# Patient Record
Sex: Male | Born: 1965 | Race: White | Hispanic: No | Smoking: Current every day smoker
Health system: Southern US, Community
[De-identification: ages and names within clinical notes are randomized; demographics above are authoritative.]

## PROBLEM LIST (undated history)

## (undated) DIAGNOSIS — IMO0002 Reserved for concepts with insufficient information to code with codable children: Secondary | ICD-10-CM

## (undated) DIAGNOSIS — M79604 Pain in right leg: Secondary | ICD-10-CM

## (undated) DIAGNOSIS — G8929 Other chronic pain: Secondary | ICD-10-CM

## (undated) DIAGNOSIS — K219 Gastro-esophageal reflux disease without esophagitis: Secondary | ICD-10-CM

## (undated) DIAGNOSIS — F329 Major depressive disorder, single episode, unspecified: Secondary | ICD-10-CM

## (undated) DIAGNOSIS — N200 Calculus of kidney: Secondary | ICD-10-CM

## (undated) DIAGNOSIS — F32A Depression, unspecified: Secondary | ICD-10-CM

## (undated) DIAGNOSIS — M25569 Pain in unspecified knee: Secondary | ICD-10-CM

## (undated) DIAGNOSIS — G4733 Obstructive sleep apnea (adult) (pediatric): Secondary | ICD-10-CM

## (undated) DIAGNOSIS — F419 Anxiety disorder, unspecified: Secondary | ICD-10-CM

## (undated) DIAGNOSIS — R0600 Dyspnea, unspecified: Secondary | ICD-10-CM

## (undated) HISTORY — DX: Other chronic pain: M25.569

## (undated) HISTORY — DX: Reserved for concepts with insufficient information to code with codable children: IMO0002

## (undated) HISTORY — DX: Pain in right leg: M79.604

## (undated) HISTORY — DX: Other chronic pain: G89.29

## (undated) HISTORY — DX: Anxiety disorder, unspecified: F41.9

## (undated) HISTORY — DX: Depression, unspecified: F32.A

## (undated) HISTORY — DX: Obstructive sleep apnea (adult) (pediatric): G47.33

## (undated) HISTORY — DX: Major depressive disorder, single episode, unspecified: F32.9

## (undated) HISTORY — PX: KNEE SURGERY: SHX244

---

## 1999-10-27 ENCOUNTER — Encounter: Admission: RE | Admit: 1999-10-27 | Discharge: 1999-10-27 | Payer: Self-pay

## 1999-10-27 ENCOUNTER — Encounter: Payer: Self-pay | Admitting: Oral Surgery

## 1999-11-05 ENCOUNTER — Encounter: Admission: RE | Admit: 1999-11-05 | Discharge: 1999-11-05 | Payer: Self-pay | Admitting: *Deleted

## 1999-11-05 ENCOUNTER — Encounter: Payer: Self-pay | Admitting: Oral Surgery

## 2000-07-19 ENCOUNTER — Emergency Department (HOSPITAL_COMMUNITY): Admission: EM | Admit: 2000-07-19 | Discharge: 2000-07-19 | Payer: Self-pay | Admitting: *Deleted

## 2003-01-31 ENCOUNTER — Emergency Department (HOSPITAL_COMMUNITY): Admission: EM | Admit: 2003-01-31 | Discharge: 2003-01-31 | Payer: Self-pay | Admitting: Emergency Medicine

## 2003-02-03 ENCOUNTER — Emergency Department (HOSPITAL_COMMUNITY): Admission: EM | Admit: 2003-02-03 | Discharge: 2003-02-04 | Payer: Self-pay | Admitting: Emergency Medicine

## 2003-02-28 ENCOUNTER — Ambulatory Visit (HOSPITAL_COMMUNITY): Admission: RE | Admit: 2003-02-28 | Discharge: 2003-02-28 | Payer: Self-pay | Admitting: Orthopedic Surgery

## 2006-02-28 DIAGNOSIS — IMO0002 Reserved for concepts with insufficient information to code with codable children: Secondary | ICD-10-CM

## 2006-02-28 HISTORY — DX: Reserved for concepts with insufficient information to code with codable children: IMO0002

## 2006-12-16 ENCOUNTER — Emergency Department (HOSPITAL_COMMUNITY): Admission: EM | Admit: 2006-12-16 | Discharge: 2006-12-16 | Payer: Self-pay | Admitting: Emergency Medicine

## 2007-03-01 HISTORY — PX: KIDNEY STONE SURGERY: SHX686

## 2007-09-11 ENCOUNTER — Emergency Department (HOSPITAL_COMMUNITY): Admission: EM | Admit: 2007-09-11 | Discharge: 2007-09-11 | Payer: Self-pay | Admitting: Emergency Medicine

## 2007-10-11 ENCOUNTER — Emergency Department (HOSPITAL_COMMUNITY): Admission: EM | Admit: 2007-10-11 | Discharge: 2007-10-11 | Payer: Self-pay | Admitting: Emergency Medicine

## 2007-10-22 ENCOUNTER — Encounter: Payer: Self-pay | Admitting: Orthopedic Surgery

## 2007-10-22 ENCOUNTER — Emergency Department (HOSPITAL_COMMUNITY): Admission: EM | Admit: 2007-10-22 | Discharge: 2007-10-22 | Payer: Self-pay | Admitting: Emergency Medicine

## 2007-10-24 ENCOUNTER — Ambulatory Visit: Payer: Self-pay | Admitting: Orthopedic Surgery

## 2007-10-24 DIAGNOSIS — M545 Low back pain, unspecified: Secondary | ICD-10-CM | POA: Insufficient documentation

## 2007-10-26 ENCOUNTER — Encounter: Payer: Self-pay | Admitting: Orthopedic Surgery

## 2007-11-13 ENCOUNTER — Telehealth: Payer: Self-pay | Admitting: Orthopedic Surgery

## 2007-11-26 ENCOUNTER — Encounter
Admission: RE | Admit: 2007-11-26 | Discharge: 2007-11-27 | Payer: Self-pay | Admitting: Physical Medicine & Rehabilitation

## 2007-11-27 ENCOUNTER — Ambulatory Visit: Payer: Self-pay | Admitting: Physical Medicine & Rehabilitation

## 2007-12-06 ENCOUNTER — Ambulatory Visit (HOSPITAL_COMMUNITY)
Admission: RE | Admit: 2007-12-06 | Discharge: 2007-12-06 | Payer: Self-pay | Admitting: Physical Medicine & Rehabilitation

## 2008-01-30 ENCOUNTER — Emergency Department (HOSPITAL_COMMUNITY): Admission: EM | Admit: 2008-01-30 | Discharge: 2008-01-30 | Payer: Self-pay | Admitting: Emergency Medicine

## 2008-10-31 ENCOUNTER — Emergency Department (HOSPITAL_COMMUNITY): Admission: EM | Admit: 2008-10-31 | Discharge: 2008-10-31 | Payer: Self-pay | Admitting: Emergency Medicine

## 2009-04-15 ENCOUNTER — Ambulatory Visit (HOSPITAL_COMMUNITY): Admission: RE | Admit: 2009-04-15 | Discharge: 2009-04-15 | Payer: Self-pay | Admitting: Orthopaedic Surgery

## 2010-03-20 ENCOUNTER — Encounter: Payer: Self-pay | Admitting: Orthopedic Surgery

## 2010-03-21 ENCOUNTER — Encounter: Payer: Self-pay | Admitting: Physical Medicine & Rehabilitation
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
## 2010-07-13 NOTE — H&P (Signed)
NAMESHILO, PAUWELS              ACCOUNT NO.:  [id number]   MEDICAL RECORD NO.:  [id number]          PATIENT TYPE:  AMB   LOCATION:  DAY                           FACILITY:  [facility]   PHYSICIAN:  Ky Barban, M.D.DATE OF BIRTH:  [birth year]   DATE OF ADMISSION:  DATE OF DISCHARGE:  LH                              HISTORY & PHYSICAL   CHIEF COMPLAINT:  Frequent left renal colic.   HISTORY:  A 45 year old gentleman who is well known to me.  He has  frequent episodes of left renal colic.  He went to the emergency room.  A CT scan showed there is a 6-mm stone in the distal left ureter causing  some restriction, and the patient was referred to me for further  management.  He is not having any fever, chills, and voiding complaints.  He continued to have pain and wants to get the stone out, so I have  advised him to undergo stone basket for which he is coming tomorrow.  He  will undergo a cystoscopy, left retrograde pyelogram, ureteroscopic  stone basket, Holmium laser lithotripsy, and use a Double-J stent.  He  is familiar with the procedure because I have done the same procedure on  the right side last year.  I have discussed possible complications,  especially ureter perforations leading to open surgery.   PAST MEDICAL HISTORY:  Unremarkable except stone basket last year.   PHYSICAL EXAMINATION:  GENERAL:  Moderately built, not in acute  distress.  VITAL SIGNS:  Blood pressure 109/65, temperature is 97.5.  CNS:  Negative.  HEAD, NECK, ENT:  Negative.  CHEST:  Symmetrical.  HEART:  Regular sinus rhythm.  No murmur.  ABDOMEN:  Soft, flat.  Liver screen, kidneys are not palpable.  BACK:  No CVA tenderness.  EXTREMITY/GENITALIA:  Unremarkable.   IMPRESSION:  Left uretal calculus.   PLAN:  Cystoscopy, left retrograde pyelogram, ureteroscopic stone  basket, Holmium laser lithotripsy, and insertion of a Double-J stent  done under anesthesia as an outpatient.      Ky Barban, M.D.  Electronically Signed     MIJ/MEDQ  D:  06/04/2007  T:  06/04/2007  Job:  [id number]

## 2011-04-25 ENCOUNTER — Ambulatory Visit: Payer: Self-pay | Admitting: Family Medicine

## 2011-04-27 ENCOUNTER — Encounter: Payer: Self-pay | Admitting: Family Medicine

## 2011-05-23 ENCOUNTER — Ambulatory Visit (INDEPENDENT_AMBULATORY_CARE_PROVIDER_SITE_OTHER): Payer: Medicare Other | Admitting: Family Medicine

## 2011-05-23 ENCOUNTER — Encounter: Payer: Self-pay | Admitting: Family Medicine

## 2011-05-23 VITALS — BP 122/74 | HR 58 | Resp 18 | Ht 68.0 in | Wt 327.0 lb

## 2011-05-23 DIAGNOSIS — M79609 Pain in unspecified limb: Secondary | ICD-10-CM

## 2011-05-23 DIAGNOSIS — M545 Low back pain, unspecified: Secondary | ICD-10-CM

## 2011-05-23 DIAGNOSIS — Z720 Tobacco use: Secondary | ICD-10-CM

## 2011-05-23 DIAGNOSIS — R7309 Other abnormal glucose: Secondary | ICD-10-CM

## 2011-05-23 DIAGNOSIS — E669 Obesity, unspecified: Secondary | ICD-10-CM

## 2011-05-23 DIAGNOSIS — F17200 Nicotine dependence, unspecified, uncomplicated: Secondary | ICD-10-CM

## 2011-05-23 DIAGNOSIS — M25559 Pain in unspecified hip: Secondary | ICD-10-CM

## 2011-05-23 DIAGNOSIS — G4733 Obstructive sleep apnea (adult) (pediatric): Secondary | ICD-10-CM | POA: Insufficient documentation

## 2011-05-23 DIAGNOSIS — R739 Hyperglycemia, unspecified: Secondary | ICD-10-CM

## 2011-05-23 DIAGNOSIS — M79676 Pain in unspecified toe(s): Secondary | ICD-10-CM | POA: Insufficient documentation

## 2011-05-23 MED ORDER — OXYCODONE-ACETAMINOPHEN 7.5-325 MG PO TABS: 1.0000 | ORAL_TABLET | Freq: Two times a day (BID) | ORAL | Status: DC | PRN

## 2011-05-23 NOTE — Assessment & Plan Note (Signed)
Acute on chronic pain, given pain meds for 30 days, I will obtain records

## 2011-05-23 NOTE — Patient Instructions (Signed)
Use the pain medication twice a day  Try to walk as much  I will get records  Get the labs done we will review at your next visit I recommend you quit smoking! F/U in 4 weeks

## 2011-05-23 NOTE — Assessment & Plan Note (Signed)
Exam unremarkable, will obtain uric acid level

## 2011-05-23 NOTE — Assessment & Plan Note (Signed)
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

## 2011-05-23 NOTE — Assessment & Plan Note (Signed)
Obtain labs, will review, if he has no other new co-morbidites he may be a good candidate for phentermine
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
## 2011-05-23 NOTE — Assessment & Plan Note (Signed)
Discussed need for cessation 

## 2011-05-27 LAB — COMPREHENSIVE METABOLIC PANEL
Alkaline Phosphatase: 71 U/L (ref 39–117)
BUN: 9 mg/dL (ref 6–23)
Glucose, Bld: 102 mg/dL — ABNORMAL HIGH (ref 70–99)
Total Bilirubin: 0.2 mg/dL — ABNORMAL LOW (ref 0.3–1.2)

## 2011-05-27 LAB — LIPID PANEL
Cholesterol: 177 mg/dL (ref 0–200)
LDL Cholesterol: 118 mg/dL — ABNORMAL HIGH (ref 0–99)
Total CHOL/HDL Ratio: 5.4 Ratio

## 2011-05-27 LAB — CBC
MCH: 29.8 pg (ref 26.0–34.0)
MCHC: 32.5 g/dL (ref 30.0–36.0)
Platelets: 386 10*3/uL (ref 150–400)
RDW: 15.1 % (ref 11.5–15.5)
WBC: 10.5 10*3/uL (ref 4.0–10.5)

## 2011-06-08 ENCOUNTER — Telehealth: Payer: Self-pay | Admitting: Family Medicine

## 2011-06-08 NOTE — Telephone Encounter (Signed)
We can discuss this at his f/u visit

## 2011-06-09 NOTE — Telephone Encounter (Signed)
noted 

## 2011-06-21 ENCOUNTER — Other Ambulatory Visit: Payer: Self-pay | Admitting: *Deleted

## 2011-06-21 ENCOUNTER — Ambulatory Visit (INDEPENDENT_AMBULATORY_CARE_PROVIDER_SITE_OTHER): Payer: Medicare Other

## 2011-06-21 ENCOUNTER — Ambulatory Visit (INDEPENDENT_AMBULATORY_CARE_PROVIDER_SITE_OTHER): Payer: Medicare Other | Admitting: Orthopedic Surgery

## 2011-06-21 ENCOUNTER — Encounter: Payer: Self-pay | Admitting: Orthopedic Surgery

## 2011-06-21 VITALS — BP 126/60 | Ht 67.0 in | Wt 327.0 lb

## 2011-06-21 DIAGNOSIS — G8929 Other chronic pain: Secondary | ICD-10-CM

## 2011-06-21 DIAGNOSIS — M25569 Pain in unspecified knee: Secondary | ICD-10-CM

## 2011-06-21 DIAGNOSIS — M25561 Pain in right knee: Secondary | ICD-10-CM

## 2011-06-21 NOTE — Patient Instructions (Signed)
Refer to pain clinic? 

## 2011-06-22 ENCOUNTER — Encounter: Payer: Self-pay | Admitting: Orthopedic Surgery

## 2011-06-22 DIAGNOSIS — G8929 Other chronic pain: Secondary | ICD-10-CM | POA: Insufficient documentation

## 2011-06-22 DIAGNOSIS — M25561 Pain in right knee: Secondary | ICD-10-CM | POA: Insufficient documentation

## 2011-06-22 NOTE — Progress Notes (Signed)
  Subjective:    Michael Tate is a 46 y.o. male who presents with knee pain involving The right knee and the right tibia. Onset was gradual, starting about several Years ago. Inciting event: Was a fracture which was treated with intramedullary nailing. The nail fractured and the patient subsequently was treated with plate fixation at [facility]. Current symptoms include: Pain over the shin proximally as well as pain over the medial femoral condyle. He is also suffering from chronic back pain and chronic pain syndrome.. Pain is aggravated by any weight bearing, going up and down stairs, rising after sitting, standing and walking. Patient has had prior knee problems. Evaluation to date: none. Treatment to date: none.  The following portions of the patient's history were reviewed and updated as appropriate: allergies, current medications, past family history, past medical history, past social history, past surgical history and problem list.   Review of Systems Pertinent items are noted in HPI.   Objective:    BP 126/60  Ht 5\' 7"  (1.702 m)  Wt 327 lb (148.326 kg)  BMI 51.22 kg/m2 Right knee: positive exam findings: crepitus, medial joint line tenderness, ROM limited to approximately 115 degrees and  and negative exam findings: no effusion, no erythema, ACL stable, PCL stable, MCL stable, LCL stable, no patellar laxity and There is also tenderness and pain over the proximal shin. There are 2 incisions one midline one starts medial and curves into the midline incision both are clean without signs of erythema or tenderness  Left knee:  normal and no effusion, full active range of motion, no joint line tenderness, ligamentous structures intact.   X-ray right knee: The medial compartment is compromised with moderate arthrosis and joint space narrowing and there is a plate fixating a previously proximal tibial fracture with good union and no complicating features from the hardware    Assessment:     Right Knee pain. Chronic pain. Chronic back pain.    Plan:    The patient is not a candidate for surgical intervention at this time. He needs chronic pain management and agreed that he really came in to the office today to try to get his pain under control. He is referred to a pain management clinic.

## 2011-06-23 ENCOUNTER — Ambulatory Visit (INDEPENDENT_AMBULATORY_CARE_PROVIDER_SITE_OTHER): Payer: Medicare Other | Admitting: Family Medicine

## 2011-06-23 ENCOUNTER — Encounter: Payer: Self-pay | Admitting: Family Medicine

## 2011-06-23 VITALS — BP 128/74 | HR 72 | Resp 20 | Ht 68.0 in | Wt 328.1 lb

## 2011-06-23 DIAGNOSIS — Z720 Tobacco use: Secondary | ICD-10-CM

## 2011-06-23 DIAGNOSIS — F17200 Nicotine dependence, unspecified, uncomplicated: Secondary | ICD-10-CM

## 2011-06-23 DIAGNOSIS — R7989 Other specified abnormal findings of blood chemistry: Secondary | ICD-10-CM

## 2011-06-23 DIAGNOSIS — Z0289 Encounter for other administrative examinations: Secondary | ICD-10-CM

## 2011-06-23 DIAGNOSIS — G4733 Obstructive sleep apnea (adult) (pediatric): Secondary | ICD-10-CM

## 2011-06-23 DIAGNOSIS — Z20828 Contact with and (suspected) exposure to other viral communicable diseases: Secondary | ICD-10-CM

## 2011-06-23 DIAGNOSIS — G8929 Other chronic pain: Secondary | ICD-10-CM

## 2011-06-23 MED ORDER — OXYCODONE-ACETAMINOPHEN 7.5-325 MG PO TABS: 1.0000 | ORAL_TABLET | Freq: Two times a day (BID) | ORAL | Status: DC | PRN

## 2011-06-23 NOTE — Patient Instructions (Signed)
I will set you up for a sleep study You have signed a pain contract Work on the smoking  Albuterol inhaler as needed Liver ultrasound to be done Get the bloodwork for your liver- we will call with results F/U 4 months

## 2011-06-24 ENCOUNTER — Encounter: Payer: Self-pay | Admitting: *Deleted

## 2011-06-24 LAB — DRUG SCREEN, URINE
Amphetamine Screen, Ur: NEGATIVE
Barbiturate Quant, Ur: NEGATIVE
Cocaine Metabolites: NEGATIVE
Opiates: NEGATIVE

## 2011-06-24 LAB — HEPATIC FUNCTION PANEL
ALT: 28 U/L (ref 0–53)
AST: 20 U/L (ref 0–37)
Alkaline Phosphatase: 69 U/L (ref 39–117)
Bilirubin, Direct: <0.1 mg/dL (ref 0.0–0.3)
Total Bilirubin: 0.2 mg/dL — ABNORMAL LOW (ref 0.3–1.2)

## 2011-06-24 LAB — HEPATITIS C ANTIBODY: HCV Ab: NEGATIVE

## 2011-06-24 NOTE — Assessment & Plan Note (Signed)
Pt was high risk for exposure with incarceration, other differentials fatty liver disease, though TG are normal. Will send for HIV, hep B and Hep c labs, ultrasound of RUQ

## 2011-06-24 NOTE — Assessment & Plan Note (Signed)
UDS obtained, pain contract signed, I will give him #90 Percocet, to be used TID prn If this does not help he will need referral to pain clinic

## 2011-06-24 NOTE — Progress Notes (Signed)
Patient ID: Michael Tate, male   DOB: 1965/05/08, 46 y.o.   MRN: [id number] Referral was faxed to dr Gerilyn Pilgrim for pain management

## 2011-06-24 NOTE — Progress Notes (Signed)
  Subjective:    Patient ID: Michael Tate, male    DOB: 1965-06-20, 46 y.o.   MRN: [id number]  HPI   Patient here to followup chronic pain. He was seen by orthopedic surgeon regarding his anemia was told there was nothing operable he can do. He will need to be referred to a pain clinic. He ran out of his medication 10 days early as he required more. Today she presents me with a note stating how severe his pain is. For the first 28 days when he was on medication he was able to move around and get things done. He states he's been a burden on his son and his mother. I will scan this letter in to the chart for documentation. He is asking for specific pain medications which have helped him in the past. He is also requesting sleep study for his sleep apnea he needs a new machine and has been many years since he has had workup for this.  Review of Systems   GEN- denies fatigue, fever, weight loss,weakness, recent illness HEENT- denies eye drainage, change in vision, nasal discharge, CVS- denies chest pain, palpitations RESP- denies SOB, cough, wheeze ABD- denies N/V, change in stools, abd pain GU- denies dysuria, hematuria, dribbling, incontinence MSK- + joint pain, muscle aches, injury Neuro- denies headache, dizziness, syncope, seizure activity    Objective:   Physical Exam EN- NAD, alert and oriented x3,morbidly obese,walks with cane HEENT-  EOMI, non injected sclera,  Non icteric pink conjunctiva, MMM, oropharynx clear CVS- RRR, no murmur RESP-CTAB ABD- NABS,soft, NT,ND, unable to palpate liver, large abdomen EXT- No edema Pulses- Radial, DP- 2+ Neuro-antalgic gait        Assessment & Plan:

## 2011-06-24 NOTE — Assessment & Plan Note (Addendum)
Epworth scale given score 17/24, obtain sleep study  Sleep study from May 2013 shows pt has OSA, and I believe he would benefit from CPAP use. Will order CPAP machine

## 2011-06-24 NOTE — Assessment & Plan Note (Signed)
Continue to encourage cessation. 

## 2011-06-27 ENCOUNTER — Telehealth: Payer: Self-pay | Admitting: Family Medicine

## 2011-06-27 MED ORDER — ALPRAZOLAM 1 MG PO TABS: ORAL_TABLET | ORAL | Status: DC

## 2011-06-27 MED ORDER — SERTRALINE HCL 100 MG PO TABS: ORAL_TABLET | ORAL | Status: DC

## 2011-06-27 MED ORDER — ARIPIPRAZOLE 5 MG PO TABS: 5.0000 mg | ORAL_TABLET | Freq: Every day | ORAL | Status: DC

## 2011-06-27 NOTE — Telephone Encounter (Signed)
I spoke with patient regarding this his failed drug test. He states that he is going to mental health he did not want to get this information because of the stigma it presents. He is currently on alprazolam 1 mg 5 times a day, Zoloft 200 mg daily and Abilify 5 mg daily. He's been prescribed these by Dr.Rey. I checked the database and he is being prescribed these medications on a monthly basis by [facility] mental health. Therefore he did not fail his drug test

## 2011-06-27 NOTE — Telephone Encounter (Signed)
LVM for pt to return call. He failed his UDS, will need to be referred to pain clinic, it appears Dr. Romeo Apple has already done this

## 2011-07-08 ENCOUNTER — Other Ambulatory Visit: Payer: Self-pay

## 2011-07-13 ENCOUNTER — Ambulatory Visit: Payer: Medicare Other | Attending: Family Medicine | Admitting: Sleep Medicine

## 2011-07-13 DIAGNOSIS — Z6841 Body Mass Index (BMI) 40.0 and over, adult: Secondary | ICD-10-CM | POA: Insufficient documentation

## 2011-07-13 DIAGNOSIS — G4733 Obstructive sleep apnea (adult) (pediatric): Secondary | ICD-10-CM | POA: Insufficient documentation

## 2011-07-14 NOTE — Progress Notes (Signed)
NPSG study ordered, however, Dr Ronal Fear emergency split protocol  was met and PAP therapy initiated with CPAP and then BiLevel to resolve respiratory events

## 2011-07-16 NOTE — Procedures (Signed)
[name], Michael Tate              ACCOUNT NO.:  [id number]  MEDICAL RECORD NO.:  [id number]          PATIENT TYPE:  OUT  LOCATION:  SLEEP LAB                     FACILITY:  [facility]  PHYSICIAN:  Katalin Colledge A. Gerilyn Pilgrim, M.D. DATE OF BIRTH:  [birth year]  DATE OF STUDY:  07/13/2011                           NOCTURNAL POLYSOMNOGRAM  REFERRING PHYSICIAN:  Milinda Antis, MD  INDICATION:  A 46 year old man, who has a history of obstructive sleep apnea syndrome, previously has used the machine, but is currently not working.  He has hypersomnia, fatigue with witnessed apnea and snoring.  EPWORTH SLEEPINESS SCORE:  16.  BMI:  48.  ARCHITECTURAL SUMMARY:  This is a split night recording with initial portion being a diagnostic and the second portion a titration recording. The total recording time is 401 minutes.  Sleep efficiency 82%.  Sleep latency 20 minutes.  REM latency 250 minutes.  RESPIRATORY SUMMARY:  Baseline oxygen saturation is 95.  Lowest saturation 79 during non-REM sleep.  Diagnostic AHI is 90.  The patient was started on positive pressure between 8 and titrated to a pressure of 14.  He also did well while on CPAP.  He did have a couple of central apneas on level 14 and was subsequently switched to bilevel pressures. He did really well on all the bilevel pressures.  Optimal bilevel pressure 18/14.  LIMB MOVEMENT SUMMARY:  PLM index 1.  ELECTROCARDIOGRAM SUMMARY:  Average heart rate is 67 with no significant dysrhythmias observed.  IMPRESSION:  Severe obstructive sleep apnea syndrome, which responds well to CPAP of 14.  He also responded well to bilevel of 18/14.  RECOMMENDATION:  I would suggest CPAP of 14.  MEDICATIONS:  Zoloft, alprazolam, Abilify.    Vidhi Delellis A. Gerilyn Pilgrim, M.D.    KAD/MEDQ  D:  07/16/2011 17:23:56  T:  07/16/2011 23:46:14  Job:  [id number]

## 2011-07-19 ENCOUNTER — Telehealth: Payer: Self-pay | Admitting: Family Medicine

## 2011-07-19 NOTE — Telephone Encounter (Signed)
You can print, he can pick up but he can not fill until Thursday.

## 2011-07-19 NOTE — Telephone Encounter (Signed)
Called patient and left message for them to return call at the office   

## 2011-07-19 NOTE — Telephone Encounter (Signed)
Ok to collect this now? Due on the 25th

## 2011-07-19 NOTE — Telephone Encounter (Signed)
Patient aware.

## 2011-07-20 ENCOUNTER — Other Ambulatory Visit: Payer: Self-pay

## 2011-07-20 MED ORDER — OXYCODONE-ACETAMINOPHEN 7.5-325 MG PO TABS: 1.0000 | ORAL_TABLET | Freq: Two times a day (BID) | ORAL | Status: DC | PRN

## 2011-07-20 MED ORDER — OXYCODONE-ACETAMINOPHEN 7.5-325 MG PO TABS: 1.0000 | ORAL_TABLET | Freq: Three times a day (TID) | ORAL | Status: DC | PRN

## 2011-07-26 ENCOUNTER — Telehealth: Payer: Self-pay

## 2011-07-26 NOTE — Telephone Encounter (Signed)
Pt would like to know the results of his sleep study [phone]

## 2011-07-26 NOTE — Telephone Encounter (Signed)
Pt aware.

## 2011-07-26 NOTE — Telephone Encounter (Signed)
He has severe sleep apnea and needs the CPAP Machine, this will be sent to [facility]

## 2011-08-17 ENCOUNTER — Telehealth: Payer: Self-pay | Admitting: Family Medicine

## 2011-08-17 NOTE — Telephone Encounter (Signed)
I did receive his letter, he can not get prescription until Friday, I will not be increasing the amount.  You can print #90 and I will sign tomorrow

## 2011-08-17 NOTE — Telephone Encounter (Signed)
Called and left message for pt to return call.  

## 2011-08-18 ENCOUNTER — Other Ambulatory Visit: Payer: Self-pay

## 2011-08-18 ENCOUNTER — Telehealth: Payer: Self-pay | Admitting: Family Medicine

## 2011-08-18 MED ORDER — OXYCODONE-ACETAMINOPHEN 7.5-325 MG PO TABS: 1.0000 | ORAL_TABLET | Freq: Three times a day (TID) | ORAL | Status: DC | PRN

## 2011-08-18 NOTE — Telephone Encounter (Signed)
Spoke with pt and he is aware that he can collect script on 6/21

## 2011-08-18 NOTE — Telephone Encounter (Signed)
Pt aware that he can pick up rx on Friday 6/21 and that he will not receive an increase in quantity.

## 2011-09-14 ENCOUNTER — Telehealth: Payer: Self-pay | Admitting: Family Medicine

## 2011-09-14 NOTE — Telephone Encounter (Signed)
Depending on the weather and his activity he is running out of meds for 2 days. Tomorrow he will be out of meds. Is there any he can get an extra 10 pills a month. He tries to take it only when he absolutely needs it but no matter how hard he tries he runs out a couple days before the end of the month. OR if you can't increase the quantity, could you increase the mg. He is always out the last 2 days of each month and is in severe pain until he can get his meds again

## 2011-09-14 NOTE — Telephone Encounter (Signed)
Please schedule appt

## 2011-09-14 NOTE — Telephone Encounter (Signed)
Please have him schedule an office visit.  

## 2011-09-15 ENCOUNTER — Encounter: Payer: Self-pay | Admitting: Family Medicine

## 2011-09-15 ENCOUNTER — Ambulatory Visit (INDEPENDENT_AMBULATORY_CARE_PROVIDER_SITE_OTHER): Payer: Medicare Other | Admitting: Family Medicine

## 2011-09-15 VITALS — BP 140/84 | HR 81 | Resp 18 | Ht 68.0 in | Wt 327.0 lb

## 2011-09-15 DIAGNOSIS — G8929 Other chronic pain: Secondary | ICD-10-CM | POA: Insufficient documentation

## 2011-09-15 DIAGNOSIS — Z2089 Contact with and (suspected) exposure to other communicable diseases: Secondary | ICD-10-CM | POA: Insufficient documentation

## 2011-09-15 DIAGNOSIS — H109 Unspecified conjunctivitis: Secondary | ICD-10-CM | POA: Insufficient documentation

## 2011-09-15 DIAGNOSIS — Z113 Encounter for screening for infections with a predominantly sexual mode of transmission: Secondary | ICD-10-CM

## 2011-09-15 DIAGNOSIS — R300 Dysuria: Secondary | ICD-10-CM | POA: Insufficient documentation

## 2011-09-15 DIAGNOSIS — Z202 Contact with and (suspected) exposure to infections with a predominantly sexual mode of transmission: Secondary | ICD-10-CM

## 2011-09-15 LAB — POCT URINALYSIS DIPSTICK
Bilirubin, UA: NEGATIVE
Blood, UA: NEGATIVE
Glucose, UA: NEGATIVE
Ketones, UA: NEGATIVE
Leukocytes, UA: NEGATIVE
pH, UA: 7

## 2011-09-15 MED ORDER — OXYCODONE-ACETAMINOPHEN 7.5-325 MG PO TABS: 1.0000 | ORAL_TABLET | Freq: Three times a day (TID) | ORAL | Status: DC | PRN

## 2011-09-15 MED ORDER — TAMSULOSIN HCL 0.4 MG PO CAPS: 0.4000 mg | ORAL_CAPSULE | Freq: Every day | ORAL | Status: DC

## 2011-09-15 MED ORDER — METRONIDAZOLE 500 MG PO TABS: 500.0000 mg | ORAL_TABLET | Freq: Two times a day (BID) | ORAL | Status: AC

## 2011-09-15 MED ORDER — POLYMYXIN B-TRIMETHOPRIM 10000-0.1 UNIT/ML-% OP SOLN: 1.0000 [drp] | Freq: Four times a day (QID) | OPHTHALMIC | Status: AC

## 2011-09-15 NOTE — Patient Instructions (Addendum)
Take the antibiotic for the eyes for the conjunctivitis Take the flagyl for infection Continue your CPAP Phentermine start 1/2 tablet for 2 weeks then increase to 1 tablets - start after the flagyl  Fresh fruits and veggies, avoid fried foods, bread  Start flomax F/U 3 months

## 2011-09-15 NOTE — Progress Notes (Signed)
  Subjective:    Patient ID: Michael Tate, male    DOB: 01/31/1966, 46 y.o.   MRN: [id number]  HPI Pt presents s/p exposure to Trichomonas, he has been have eye drainage for 1 month, this is same time frame as exposure he has also had burning sensation with urination and in groin. Past week he has had difficulty with his urine stream. He was concerned he had a kidney stone. Denies penile discharge, penile lesions   Review of Systems  GEN- denies fatigue, fever, weight loss,weakness, recent illness HEENT- denies eye drainage, change in vision, nasal discharge, CVS- denies chest pain, palpitations RESP- denies SOB, cough, wheeze ABD- denies N/V, change in stools, abd pain GU- + dysuria, hematuria, dribbling, incontinence MSK- + joint pain, muscle aches, injury Neuro- denies headache, dizziness, syncope, seizure activity      Objective:   Physical Exam GEN- NAD, alert and oriented x3, obese  HEENT- PERRL, EOMI, non injected sclera, injected conjunctiva, MMM, oropharynx clear, mild clear drainage from bilat eye CVS- RRR, no murmur RESP-CTAB ABD-NABS,soft, NT,ND, no CVA tenderness EXT- No edema Pulses- Radial, DP- 2+ GU- pt declined  UA-negative, no blood seen      Assessment & Plan:

## 2011-09-18 ENCOUNTER — Encounter: Payer: Self-pay | Admitting: Family Medicine

## 2011-09-18 DIAGNOSIS — H109 Unspecified conjunctivitis: Secondary | ICD-10-CM | POA: Insufficient documentation

## 2011-09-18 DIAGNOSIS — Z202 Contact with and (suspected) exposure to infections with a predominantly sexual mode of transmission: Secondary | ICD-10-CM | POA: Insufficient documentation

## 2011-09-18 NOTE — Assessment & Plan Note (Signed)
Treat for trichomonas with flagyl, urine GC/Chlamydia done as pt refused exam, he denied any penile discharge or lesions.

## 2011-09-18 NOTE — Assessment & Plan Note (Signed)
On pain contract, we discussed him asking for more meds, and increased doses, will change monthly count to 100 tablets, advised if this does not control pain he will need pain management referral

## 2011-09-18 NOTE — Assessment & Plan Note (Signed)
Culture taken from eye but very little non purulent discharge, treat presumptively with polytrim, possible crusting due to new CPAP Mask as well

## 2011-09-20 ENCOUNTER — Other Ambulatory Visit (HOSPITAL_COMMUNITY)
Admission: RE | Admit: 2011-09-20 | Discharge: 2011-09-20 | Disposition: A | Discharge status: Home / Self Care | Payer: Medicare Other | Source: Ambulatory Visit | Attending: Family Medicine | Admitting: Family Medicine

## 2011-09-22 ENCOUNTER — Telehealth: Payer: Self-pay | Admitting: Family Medicine

## 2011-10-10 MED ORDER — PHENTERMINE HCL 37.5 MG PO CAPS: 37.5000 mg | ORAL_CAPSULE | ORAL | Status: DC

## 2011-10-10 NOTE — Telephone Encounter (Signed)
Called pt and left message. Phentermine sent in

## 2011-10-11 ENCOUNTER — Other Ambulatory Visit: Payer: Self-pay

## 2011-10-11 MED ORDER — OXYCODONE-ACETAMINOPHEN 7.5-325 MG PO TABS
1.0000 | ORAL_TABLET | Freq: Three times a day (TID) | ORAL | Status: DC | PRN
Start: 2011-10-11 — End: 2011-11-04

## 2011-10-21 ENCOUNTER — Ambulatory Visit: Payer: Medicare Other | Admitting: Family Medicine

## 2011-11-04 ENCOUNTER — Other Ambulatory Visit: Payer: Self-pay

## 2011-11-04 MED ORDER — OXYCODONE-ACETAMINOPHEN 7.5-325 MG PO TABS
1.0000 | ORAL_TABLET | Freq: Three times a day (TID) | ORAL | Status: DC | PRN
Start: 2011-11-04 — End: 2011-12-06

## 2011-11-09 ENCOUNTER — Telehealth: Payer: Self-pay | Admitting: Family Medicine

## 2011-11-09 NOTE — Telephone Encounter (Signed)
Called and left message notifying patient that he can pick up his rx on Friday 9/13

## 2011-11-14 ENCOUNTER — Ambulatory Visit: Payer: Medicare Other | Admitting: Family Medicine

## 2011-11-14 ENCOUNTER — Encounter: Payer: Self-pay | Admitting: Family Medicine

## 2011-12-06 ENCOUNTER — Telehealth: Payer: Self-pay | Admitting: Family Medicine

## 2011-12-06 MED ORDER — OXYCODONE-ACETAMINOPHEN 7.5-325 MG PO TABS
1.0000 | ORAL_TABLET | Freq: Three times a day (TID) | ORAL | Status: DC | PRN
Start: 2011-12-06 — End: 2012-01-04

## 2011-12-06 NOTE — Telephone Encounter (Signed)
Noted  

## 2012-01-03 ENCOUNTER — Telehealth: Payer: Self-pay | Admitting: Family Medicine

## 2012-01-04 MED ORDER — OXYCODONE-ACETAMINOPHEN 7.5-325 MG PO TABS
1.0000 | ORAL_TABLET | Freq: Three times a day (TID) | ORAL | Status: DC | PRN
Start: 2012-01-04 — End: 2012-01-24

## 2012-01-04 NOTE — Telephone Encounter (Signed)
Message left that it would be ready on Thursday

## 2012-01-12 ENCOUNTER — Ambulatory Visit: Payer: Medicare Other | Admitting: Family Medicine

## 2012-01-24 ENCOUNTER — Other Ambulatory Visit: Payer: Self-pay

## 2012-01-24 MED ORDER — OXYCODONE-ACETAMINOPHEN 7.5-325 MG PO TABS: 1.0000 | ORAL_TABLET | Freq: Three times a day (TID) | ORAL | Status: DC | PRN

## 2012-01-31 ENCOUNTER — Encounter: Payer: Self-pay | Admitting: Family Medicine

## 2012-01-31 ENCOUNTER — Ambulatory Visit: Payer: Medicare Other | Admitting: Family Medicine

## 2012-02-02 ENCOUNTER — Other Ambulatory Visit: Payer: Self-pay

## 2012-02-02 ENCOUNTER — Other Ambulatory Visit: Payer: Self-pay | Admitting: Family Medicine

## 2012-02-02 DIAGNOSIS — Z79899 Other long term (current) drug therapy: Secondary | ICD-10-CM

## 2012-02-03 LAB — DRUG SCREEN, URINE
Amphetamine Screen, Ur: NEGATIVE
Benzodiazepines.: NEGATIVE
Creatinine,U: 250.09 mg/dL
Methadone: NEGATIVE
Propoxyphene: NEGATIVE

## 2012-02-07 NOTE — Progress Notes (Signed)
Called pt and one number has ben disconnected and the other no one would speak.

## 2012-02-07 NOTE — Progress Notes (Signed)
Pt is coming in on Thursday 12th to see dr. Jeanice Lim. Pt is aware

## 2012-02-09 ENCOUNTER — Encounter: Payer: Self-pay | Admitting: Family Medicine

## 2012-02-09 ENCOUNTER — Ambulatory Visit (INDEPENDENT_AMBULATORY_CARE_PROVIDER_SITE_OTHER): Payer: Medicare Other | Admitting: Family Medicine

## 2012-02-09 VITALS — BP 118/74 | HR 76 | Resp 16 | Wt 325.0 lb

## 2012-02-09 DIAGNOSIS — G8929 Other chronic pain: Secondary | ICD-10-CM

## 2012-02-09 DIAGNOSIS — M5136 Other intervertebral disc degeneration, lumbar region: Secondary | ICD-10-CM

## 2012-02-09 DIAGNOSIS — M5137 Other intervertebral disc degeneration, lumbosacral region: Secondary | ICD-10-CM

## 2012-02-09 DIAGNOSIS — M79609 Pain in unspecified limb: Secondary | ICD-10-CM

## 2012-02-09 DIAGNOSIS — M51379 Other intervertebral disc degeneration, lumbosacral region without mention of lumbar back pain or lower extremity pain: Secondary | ICD-10-CM

## 2012-02-09 NOTE — Assessment & Plan Note (Signed)
Unfortunately he has violated contract in my office, he does have disease that warrents pain.  discussed I will not provide any further narcotic medications to him. He will need to seek care at a pain clinic there may be some other options for his chronic pain.  Copy of UDS sent to his psychiatrist who prescribes to the xanax

## 2012-02-09 NOTE — Patient Instructions (Signed)
Pain clinic referral I will no longer prescribe narcotics for you

## 2012-02-09 NOTE — Progress Notes (Signed)
  Subjective:    Patient ID: Michael Tate, male    DOB: [birth year], 46 y.o.   MRN: [id number]  HPI Pt brought in for visit secondary to failed UDS, his UDS did not show any opiates or benzo, though he is prescribed tablets monthly as well as benzo by his psychiatrist. Furthermore he received narcotics from both dentist and another provider noted on [place] [facility] database. He states no meds were in urine because he used them up taking 5 a day when he had his dental procedure, note dental procedure was in Oct 2013 when he was given hydrocodone per database. Denies divergence of meds or other misuse.    Review of Systems - per above     Objective:   Physical Exam  GEN-NAD, alert and oriented x 3 Psych- anxious appearing      Assessment & Plan:

## 2012-02-13 ENCOUNTER — Telehealth: Payer: Self-pay | Admitting: Family Medicine

## 2012-02-13 NOTE — Telephone Encounter (Signed)
YOu can send to Dr. Laurian Brim

## 2012-02-15 NOTE — Telephone Encounter (Signed)
Left message stating that I have faxed over his papers to Dr. Harold Hedge office

## 2012-02-15 NOTE — Telephone Encounter (Signed)
Faxed papers to Dr. Harold Hedge office

## 2012-03-02 ENCOUNTER — Ambulatory Visit: Payer: Medicare Other | Admitting: Family Medicine

## 2012-03-06 ENCOUNTER — Telehealth: Payer: Self-pay | Admitting: Family Medicine

## 2012-03-06 NOTE — Telephone Encounter (Signed)
I will forward the message to PCP but per last office visit she states she will no longer prescribe him any narcotics.

## 2012-03-12 NOTE — Telephone Encounter (Signed)
He broke pain contract, i will not prescribe any medications for him, discussed this with him at the visit You can send a letter if needed, as I have already discussed this with him.

## 2012-03-13 ENCOUNTER — Telehealth: Payer: Self-pay | Admitting: Family Medicine

## 2012-03-13 NOTE — Telephone Encounter (Signed)
He will have to await decision from Dr. Mosetta Pigeon office before any further referrals, I will not prescribe any medications to him as I discussed in office Letter will be sent to patient  We have already discussed this matter

## 2012-03-13 NOTE — Telephone Encounter (Signed)
Patient aware.  See next telephone message.  

## 2012-03-14 NOTE — Telephone Encounter (Signed)
Noted  

## 2012-03-15 ENCOUNTER — Encounter: Payer: Self-pay | Admitting: Family Medicine

## 2012-03-16 ENCOUNTER — Telehealth: Payer: Self-pay | Admitting: Family Medicine

## 2012-03-16 NOTE — Telephone Encounter (Signed)
Called and patient offered appointment to come in .

## 2012-03-23 ENCOUNTER — Ambulatory Visit: Payer: Medicare Other | Admitting: Family Medicine

## 2012-03-29 ENCOUNTER — Ambulatory Visit (INDEPENDENT_AMBULATORY_CARE_PROVIDER_SITE_OTHER): Payer: Medicare Other | Admitting: Family Medicine

## 2012-03-29 ENCOUNTER — Encounter: Payer: Self-pay | Admitting: Family Medicine

## 2012-03-29 VITALS — BP 120/80 | HR 65 | Resp 16 | Ht 68.0 in | Wt 333.4 lb

## 2012-03-29 DIAGNOSIS — R7402 Elevation of levels of lactic acid dehydrogenase (LDH): Secondary | ICD-10-CM

## 2012-03-29 DIAGNOSIS — R7302 Impaired glucose tolerance (oral): Secondary | ICD-10-CM

## 2012-03-29 DIAGNOSIS — R748 Abnormal levels of other serum enzymes: Secondary | ICD-10-CM

## 2012-03-29 DIAGNOSIS — R7309 Other abnormal glucose: Secondary | ICD-10-CM

## 2012-03-29 DIAGNOSIS — R7401 Elevation of levels of liver transaminase levels: Secondary | ICD-10-CM

## 2012-03-29 DIAGNOSIS — G8929 Other chronic pain: Secondary | ICD-10-CM

## 2012-03-29 DIAGNOSIS — R7989 Other specified abnormal findings of blood chemistry: Secondary | ICD-10-CM

## 2012-03-29 DIAGNOSIS — M545 Low back pain, unspecified: Secondary | ICD-10-CM

## 2012-03-29 DIAGNOSIS — E669 Obesity, unspecified: Secondary | ICD-10-CM
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
## 2012-03-29 NOTE — Assessment & Plan Note (Signed)
Continues to gain weight discussed how this affects his health and chronic pain Recheck A1C had mild glucose intolerance

## 2012-03-29 NOTE — Assessment & Plan Note (Signed)
Recheck his LFT

## 2012-03-29 NOTE — Progress Notes (Signed)
  Subjective:    Patient ID: Michael Tate, male    DOB: March 09, 1965, 47 y.o.   MRN: [id number]  HPI  Patient here to discuss his pain medication and his previous pain contract. Patient was seen in my office Monday, September 12 secondary to failed drug screen. He had a drug screen done which was random on December 5 he was being prescribed Percocet by my office 90 tablets a month and he was taking the supplemental regular basis. He was also being prescribed Xanax by his psychiatrist his urine drug screen was negative for all narcotic medications and benzodiazepines. At that time he states that he had ran out of medication and then he got medication from his dentist. We discussed this as well which was a violation to his pain contract. He then wrote me a 6 page letter stating that now he remembers that he had a stomach bug and he didn't take his medications at all. He also states he thinks he may of had a seizure however he did not do the hospital after EMS called and he thinks because he didn't have any medicine in the system. He then went on to say that he has been buying medication off the street however it is too expensive for him. He has pain all over in his back his legs his ankles and is making him more depressed. He also asked for a CNA   Review of Systems - per above  GEN- denies fatigue, fever, weight loss,weakness, recent illness HEENT- denies eye drainage, change in vision, nasal discharge, CVS- denies chest pain, palpitations RESP- denies SOB, cough, wheeze MSK- + joint pain, muscle aches, injury Neuro- denies headache, dizziness, syncope, seizure activity       Objective:   Physical Exam GEN-NAD,alert and oriented Walks with cane Psych- normal affect and mood        Assessment & Plan:

## 2012-03-29 NOTE — Patient Instructions (Addendum)
Referral to [facility] in [place] Get the labs done  Continue all other medications  F/U 6 months

## 2012-03-29 NOTE — Assessment & Plan Note (Signed)
I advised him again he felt a sharp screen I would not provide him with any narcotic medications at the time it is possible that it which she described as a seizure which I do not believe based on his history he would not have had narcotic medications for over 30 days. He was not accepted by the pain management Center in [facility] advised he is a very slim chance of being accepted by a pain clinic based on his history. He is asking for referral to the [facility] pain management Center in [place] I will go ahead and do this. If this does not work out he understands he might get medications from me and he thinks he may seek another primary care provider.  He does not qualify for a CNA

## 2012-04-16 ENCOUNTER — Ambulatory Visit (HOSPITAL_COMMUNITY)
Admission: RE | Admit: 2012-04-16 | Discharge: 2012-04-16 | Disposition: A | Discharge status: Home / Self Care | Payer: Medicare Other | Source: Ambulatory Visit | Attending: Anesthesiology | Admitting: Anesthesiology

## 2012-04-16 DIAGNOSIS — IMO0001 Reserved for inherently not codable concepts without codable children: Secondary | ICD-10-CM | POA: Insufficient documentation

## 2012-04-16 DIAGNOSIS — R269 Unspecified abnormalities of gait and mobility: Secondary | ICD-10-CM | POA: Insufficient documentation

## 2012-04-16 DIAGNOSIS — M545 Low back pain, unspecified: Secondary | ICD-10-CM | POA: Insufficient documentation

## 2012-04-16 DIAGNOSIS — M25559 Pain in unspecified hip: Secondary | ICD-10-CM | POA: Insufficient documentation
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
## 2012-04-24 ENCOUNTER — Ambulatory Visit (HOSPITAL_COMMUNITY)
Admission: RE | Admit: 2012-04-24 | Discharge: 2012-04-24 | Disposition: A | Discharge status: Home / Self Care | Payer: Medicare Other | Source: Ambulatory Visit | Attending: Anesthesiology | Admitting: Anesthesiology

## 2012-04-24 NOTE — Progress Notes (Signed)
Physical Therapy Treatment Patient Details  Name: Michael Tate MRN: [id number] Date of Birth: Dec 07, 1965  Today's Date: 04/24/2012 Time: 1015-1100 PT Time Calculation (min): 45 min  Visit#: 2 of 8  Re-eval: 05/16/12 Assessment Diagnosis: R hip and LBP Next MD Visit:    Prior Therapy: For LBP Charge: Gait 8', therex 35'  Authorization: Medicare  Authorization Visit#: 2 of 10   Subjective: Symptoms/Limitations Symptoms: Pt. stated LBP and R LE radicular pai scale 6-7/10.  Compliance with HEP, no questions. Pain Assessment Pain Score:   7 Pain Location: Back Pain Orientation: Right  Objective :  Exercise/Treatments Aerobic Tread Mill: Gait training .[name]60-->65 cyc/sec LL 86 x 6' with cueing for increased stride length and posture Standing Heel Raises: 20 reps;Limitations Heel Raises Limitations: toe raises 20x Functional Squats: 10 reps Other Standing Lumbar Exercises: Posture education to reduce stress  Seated Other Seated Lumbar Exercises: heel roll outs 5x10 sec Other Seated Lumbar Exercises: PFC 5x 10" Supine Bent Knee Raise: 5 reps;3 seconds Bridge: 10 reps Isometric Hip Flexion: 5 reps;5 seconds Other Supine Lumbar Exercises:   Sidelying Clam: 5 reps;Limitations Clam Limitations: 10" holds  Physical Therapy Assessment and Plan PT Assessment and Plan Clinical Impression Statement: Began POC for core strengthening and education on importance of posture.  Pt did require constant cueing for diaphragmatic breathing with activites.  Began gait training on TM to improve gait mechanics with cueing for posture, increase stride length and heel to toe to normalize mechanics. PT Plan: Continue current POC for core and posture strengthening, imporoved gait mechanics and pain reduction.  Progress to balance activties and stair training when ready.    Goals    Problem List Patient Active Problem List  Diagnosis  . LOW BACK PAIN  . Obesity  . Hip pain  . OSA  (obstructive sleep apnea)  . Tobacco use  . Toe pain  . Right knee pain  . Chronic pain  . Elevated LFTs  . Exposure to STD  . Conjunctivitis    PT - End of Session Activity Tolerance: Patient tolerated treatment well;Patient limited by fatigue General Behavior During Session: [facility] for tasks performed Cognition: [facility] for tasks performed  GP    Juel Burrow 04/24/2012, 12:21 PM

## 2012-04-26 ENCOUNTER — Inpatient Hospital Stay (HOSPITAL_COMMUNITY): Admission: RE | Admit: 2012-04-26 | Payer: Medicare Other | Source: Ambulatory Visit

## 2012-05-01 ENCOUNTER — Inpatient Hospital Stay (HOSPITAL_COMMUNITY): Admission: RE | Admit: 2012-05-01 | Payer: Medicare Other | Source: Ambulatory Visit | Admitting: Physical Therapy

## 2012-05-03 ENCOUNTER — Inpatient Hospital Stay (HOSPITAL_COMMUNITY): Admission: RE | Admit: 2012-05-03 | Payer: Medicare Other | Source: Ambulatory Visit

## 2012-05-15 ENCOUNTER — Telehealth: Payer: Self-pay | Admitting: Family Medicine

## 2012-05-15 NOTE — Telephone Encounter (Signed)
Noted  

## 2012-05-22 ENCOUNTER — Inpatient Hospital Stay (HOSPITAL_COMMUNITY): Admission: RE | Admit: 2012-05-22 | Payer: Medicare Other | Source: Ambulatory Visit

## 2012-05-24 ENCOUNTER — Ambulatory Visit (HOSPITAL_COMMUNITY): Payer: Medicare Other

## 2012-05-29 ENCOUNTER — Ambulatory Visit (HOSPITAL_COMMUNITY)
Admission: RE | Admit: 2012-05-29 | Discharge: 2012-05-29 | Disposition: A | Discharge status: Home / Self Care | Payer: Medicare Other | Source: Ambulatory Visit | Attending: Family Medicine | Admitting: Family Medicine

## 2012-05-29 DIAGNOSIS — M545 Low back pain, unspecified: Secondary | ICD-10-CM | POA: Insufficient documentation

## 2012-05-29 DIAGNOSIS — IMO0001 Reserved for inherently not codable concepts without codable children: Secondary | ICD-10-CM | POA: Insufficient documentation

## 2012-05-29 DIAGNOSIS — M25559 Pain in unspecified hip: Secondary | ICD-10-CM | POA: Insufficient documentation

## 2012-05-29 DIAGNOSIS — R269 Unspecified abnormalities of gait and mobility: Secondary | ICD-10-CM | POA: Insufficient documentation
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
## 2012-05-31 ENCOUNTER — Ambulatory Visit (HOSPITAL_COMMUNITY): Payer: Medicare Other | Admitting: Physical Therapy

## 2012-06-05 ENCOUNTER — Ambulatory Visit (HOSPITAL_COMMUNITY): Payer: Medicare Other | Admitting: *Deleted

## 2012-06-07 ENCOUNTER — Ambulatory Visit (HOSPITAL_COMMUNITY): Payer: Medicare Other | Admitting: Physical Therapy

## 2012-06-12 ENCOUNTER — Ambulatory Visit (HOSPITAL_COMMUNITY): Payer: Medicare Other | Admitting: Physical Therapy

## 2012-06-14 ENCOUNTER — Ambulatory Visit (HOSPITAL_COMMUNITY): Payer: Medicare Other | Admitting: *Deleted

## 2012-06-15 ENCOUNTER — Other Ambulatory Visit (HOSPITAL_COMMUNITY): Payer: Self-pay | Admitting: Anesthesiology

## 2012-06-15 DIAGNOSIS — M5432 Sciatica, left side: Secondary | ICD-10-CM

## 2012-06-15 DIAGNOSIS — M545 Low back pain: Secondary | ICD-10-CM

## 2012-06-15 DIAGNOSIS — M533 Sacrococcygeal disorders, not elsewhere classified: Secondary | ICD-10-CM

## 2012-06-15 DIAGNOSIS — M5137 Other intervertebral disc degeneration, lumbosacral region: Secondary | ICD-10-CM

## 2012-06-19 ENCOUNTER — Ambulatory Visit (HOSPITAL_COMMUNITY): Payer: Medicare Other

## 2012-06-26 ENCOUNTER — Ambulatory Visit (HOSPITAL_COMMUNITY)
Admission: RE | Admit: 2012-06-26 | Discharge: 2012-06-26 | Disposition: A | Discharge status: Home / Self Care | Payer: Medicare Other | Source: Ambulatory Visit | Attending: Anesthesiology | Admitting: Anesthesiology

## 2012-06-26 DIAGNOSIS — M5380 Other specified dorsopathies, site unspecified: Secondary | ICD-10-CM | POA: Insufficient documentation

## 2012-06-26 DIAGNOSIS — M545 Low back pain, unspecified: Secondary | ICD-10-CM | POA: Insufficient documentation

## 2012-06-26 DIAGNOSIS — M5137 Other intervertebral disc degeneration, lumbosacral region: Secondary | ICD-10-CM

## 2012-06-26 DIAGNOSIS — M51379 Other intervertebral disc degeneration, lumbosacral region without mention of lumbar back pain or lower extremity pain: Secondary | ICD-10-CM | POA: Insufficient documentation

## 2012-06-26 DIAGNOSIS — M5432 Sciatica, left side: Secondary | ICD-10-CM

## 2012-06-26 DIAGNOSIS — M533 Sacrococcygeal disorders, not elsewhere classified: Secondary | ICD-10-CM

## 2012-08-07 ENCOUNTER — Telehealth: Payer: Self-pay | Admitting: Family Medicine

## 2012-08-07 NOTE — Telephone Encounter (Signed)
I spoke to pt, dismissed from [facility] for diverting medications I will not prescribe him any narcotics based on his broken pain contract with me I will not provide any further referrals, he stated okay and hung up

## 2012-08-09 ENCOUNTER — Encounter (HOSPITAL_COMMUNITY): Payer: Self-pay | Admitting: *Deleted

## 2012-08-09 ENCOUNTER — Emergency Department (HOSPITAL_COMMUNITY)
Admission: EM | Admit: 2012-08-09 | Discharge: 2012-08-09 | Disposition: A | Discharge status: Home / Self Care | Payer: Medicare Other | Attending: Emergency Medicine | Admitting: Emergency Medicine

## 2012-08-09 DIAGNOSIS — L259 Unspecified contact dermatitis, unspecified cause: Secondary | ICD-10-CM | POA: Insufficient documentation

## 2012-08-09 DIAGNOSIS — L255 Unspecified contact dermatitis due to plants, except food: Secondary | ICD-10-CM

## 2012-08-09 DIAGNOSIS — M25559 Pain in unspecified hip: Secondary | ICD-10-CM | POA: Insufficient documentation

## 2012-08-09 DIAGNOSIS — M25569 Pain in unspecified knee: Secondary | ICD-10-CM | POA: Insufficient documentation

## 2012-08-09 DIAGNOSIS — G8929 Other chronic pain: Secondary | ICD-10-CM

## 2012-08-09 DIAGNOSIS — Z79899 Other long term (current) drug therapy: Secondary | ICD-10-CM | POA: Insufficient documentation

## 2012-08-09 DIAGNOSIS — M545 Low back pain, unspecified: Secondary | ICD-10-CM | POA: Insufficient documentation

## 2012-08-09 DIAGNOSIS — Z8669 Personal history of other diseases of the nervous system and sense organs: Secondary | ICD-10-CM | POA: Insufficient documentation

## 2012-08-09 DIAGNOSIS — F17200 Nicotine dependence, unspecified, uncomplicated: Secondary | ICD-10-CM | POA: Insufficient documentation

## 2012-08-09 MED ORDER — METHYLPREDNISOLONE SODIUM SUCC 125 MG IJ SOLR
125.0000 mg | Freq: Once | INTRAMUSCULAR | Status: AC
  Administered 2012-08-09: 125 mg via INTRAMUSCULAR
  Filled 2012-08-09: qty 2

## 2012-08-09 MED ORDER — OXYCODONE-ACETAMINOPHEN 5-325 MG PO TABS
2.0000 | ORAL_TABLET | Freq: Once | ORAL | Status: AC
  Administered 2012-08-09: 2 via ORAL
  Filled 2012-08-09: qty 2

## 2012-08-09 MED ORDER — PREDNISONE 10 MG PO TABS: ORAL_TABLET | ORAL | Status: DC

## 2012-08-09 MED ORDER — TRAMADOL HCL 50 MG PO TABS: 50.0000 mg | ORAL_TABLET | Freq: Four times a day (QID) | ORAL | Status: DC | PRN

## 2012-08-09 MED ORDER — CYCLOBENZAPRINE HCL 10 MG PO TABS: 10.0000 mg | ORAL_TABLET | Freq: Three times a day (TID) | ORAL | Status: DC | PRN

## 2012-08-09 NOTE — ED Notes (Signed)
nad noted prior to dc. Dc instructions reviewed with pt and explained. 3 scripts given. Ambulated out without difficulty.  

## 2012-08-09 NOTE — ED Notes (Signed)
Pt with lower back pain, had MRI recently, MD refuses to refill pain meds, pt needs pain meds til he can find another doctor
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
## 2012-08-11 NOTE — ED Provider Notes (Signed)
Medical screening examination/treatment/procedure(s) were performed by non-physician practitioner and as supervising physician I was immediately available for consultation/collaboration.   Glynn Octave, MD 08/11/12 [phone]

## 2013-05-30 ENCOUNTER — Other Ambulatory Visit (HOSPITAL_COMMUNITY): Payer: Self-pay | Admitting: Urology

## 2013-05-30 DIAGNOSIS — N200 Calculus of kidney: Secondary | ICD-10-CM

## 2013-06-04 ENCOUNTER — Ambulatory Visit (HOSPITAL_COMMUNITY)
Admission: RE | Admit: 2013-06-04 | Discharge: 2013-06-04 | Disposition: A | Discharge status: Home / Self Care | Payer: Medicare Other | Source: Ambulatory Visit | Attending: Urology | Admitting: Urology

## 2013-06-04 DIAGNOSIS — R319 Hematuria, unspecified: Secondary | ICD-10-CM | POA: Insufficient documentation

## 2013-06-04 DIAGNOSIS — N200 Calculus of kidney: Secondary | ICD-10-CM

## 2013-06-04 DIAGNOSIS — R109 Unspecified abdominal pain: Secondary | ICD-10-CM | POA: Insufficient documentation

## 2013-06-11 NOTE — Patient Instructions (Signed)
Michael Tate  06/11/2013   Your procedure is scheduled on: 06/18/2013  Report to [facility] at [place] AM.  Call this number if you have problems the morning of surgery: [phone]   Remember:   Do not eat food or drink liquids after midnight.   Take these medicines the morning of surgery with A SIP OF WATER: Xanax, Wellbutrin, Flexeril, Deltasone, Seroquel, Ultran   Do not wear jewelry, make-up or nail polish.  Do not wear lotions, powders, or perfumes.  Do not shave 48 hours prior to surgery. Men may shave face and neck.  Do not bring valuables to the hospital.  [facility] is not responsible for any belongings or valuables.               Contacts, dentures or bridgework may not be worn into surgery.  Leave suitcase in the car. After surgery it may be brought to your room.  For patients admitted to the hospital, discharge time is determined by your treatment team.               Patients discharged the day of surgery will not be allowed to drive  home.  Name and phone number of your driver: family  Special Instructions: Shower using CHG 2 nights before surgery and the night before surgery.  If you shower the day of surgery use CHG.  Use special wash - you have one bottle of CHG for all showers.  You should use approximately 1/3 of the bottle for each shower.   Please read over the following fact sheets that you were given: Pain Booklet, Coughing and Deep Breathing, Surgical Site Infection Prevention, Anesthesia Post-op Instructions and Care and Recovery After Surgery     Ureteral Stent Implantation Ureteral stent implantation is the implantation of a soft plastic tube with multiple holes into the tube that drains urine from your kidney to your bladder (ureter). The stent helps drain your kidney when there is a blockage of the flow of urine in your ureter. The stent has a coil on each end to keep it from falling out. One end stays in the kidney. The other end stays in the  bladder. It is most often taken out after any blockage has been removed or your ureter has healed. Short-term stents have a string attached to make removal quite easy. Removal of a short-term stent can be done in your health care provider's office or by you at home. Long-term stents need to be changed every few months. LET [facility] CARE PROVIDER KNOW ABOUT:  Any allergies you have.  All medicines you are taking, including vitamins, herbs, eye drops, creams, and over-the-counter medicines.  Previous problems you or members of your family have had with the use of anesthetics.  Any blood disorders you have.  Previous surgeries you have had.  Medical conditions you have. RISKS AND COMPLICATIONS Generally, ureteral stent implantation is a safe procedure. However, as with any procedure, complications can occur. Possible complications include:  Movement of the stent away from where it was originally placed (migration). This may affect the ability of the stent to properly drain your kidney. If migration of the stent occurs, the stent may need to be replaced or repositioned.  Perforation of the ureter.  Infection. BEFORE THE PROCEDURE  You may be asked to wash your genital area with sterile soap the morning of your procedure.  You may be given an oral antibiotic which you should take with a sip  of water as prescribed by your health care provider.  You may be asked to not eat or drink for 8 hours before the surgery. PROCEDURE  First you will be given an anesthetic so you do not feel pain during the procedure.  Your health care provider will insert a special lighted instrument called a cystoscope into your bladder. This allows your health care provider to see the opening to your ureter.  A thin wire is carefully threaded into your bladder and up the ureter. The stent is inserted over the wire and the wire is then removed.  Your bladder will be emptied of urine. AFTER THE PROCEDURE You  will be taken to a recovery room until it is okay for you to go home. Document Released: 02/12/2000 Document Revised: 10/17/2012 Document Reviewed: 07/24/2012 [facility] Patient Information 2014 [place]. Cystoscopy Cystoscopy is a procedure that is used to help your caregiver diagnose and sometimes treat conditions that affect your lower urinary tract. Your lower urinary tract includes your bladder and the tube through which urine passes from your bladder out of your body (urethra). Cystoscopy is performed with a thin, tube-shaped instrument (cystoscope). The cystoscope has lenses and a light at the end so that your caregiver can see inside your bladder. The cystoscope is inserted at the entrance of your urethra. Your caregiver guides it through your urethra and into your bladder. There are two main types of cystoscopy:  Flexible cystoscopy (with a flexible cystoscope).  Rigid cystoscopy (with a rigid cystoscope). Cystoscopy may be recommended for many conditions, including:  Urinary tract infections.  Blood in your urine (hematuria).  Loss of bladder control (urinary incontinence) or overactive bladder.  Unusual cells found in a urine sample.  Urinary blockage.  Painful urination. Cystoscopy may also be done to remove a sample of your tissue to be checked under a microscope (biopsy). It may also be done to remove or destroy bladder stones. LET YOUR CAREGIVER KNOW ABOUT:  Allergies to food or medicine.  Medicines taken, including vitamins, herbs, eyedrops, over-the-counter medicines, and creams.  Use of steroids (by mouth or creams).  Previous problems with anesthetics or numbing medicines.  History of bleeding problems or blood clots.  Previous surgery.  Other health problems, including diabetes and kidney problems.  Possibility of pregnancy, if this applies. PROCEDURE The area around the opening to your urethra will be cleaned. A medicine to numb your urethra (local  anesthetic) is used. If a tissue sample or stone is removed during the procedure, you may be given a medicine to make you sleep (general anesthetic). Your caregiver will gently insert the tip of the cystoscope into your urethra. The cystoscope will be slowly glided through your urethra and into your bladder. Sterile fluid will flow through the cystoscope and into your bladder. The fluid will expand and stretch your bladder. This gives your caregiver a better view of your bladder walls. The procedure lasts about 15 20 minutes. AFTER THE PROCEDURE If a local anesthetic is used, you will be allowed to go home as soon as you are ready. If a general anesthetic is used, you will be taken to a recovery area until you are stable. You may have temporary bleeding and burning on urination. Document Released: 02/12/2000 Document Revised: 11/09/2011 Document Reviewed: 08/08/2011 [facility] Patient Information 2014 [place]. PATIENT INSTRUCTIONS POST-ANESTHESIA  IMMEDIATELY FOLLOWING SURGERY:  Do not drive or operate machinery for the first twenty four hours after surgery.  Do not make any important decisions for twenty  four hours after surgery or while taking narcotic pain medications or sedatives.  If you develop intractable nausea and vomiting or a severe headache please notify your doctor immediately.  FOLLOW-UP:  Please make an appointment with your surgeon as instructed. You do not need to follow up with anesthesia unless specifically instructed to do so.  WOUND CARE INSTRUCTIONS (if applicable):  Keep a dry clean dressing on the anesthesia/puncture wound site if there is drainage.  Once the wound has quit draining you may leave it open to air.  Generally you should leave the bandage intact for twenty four hours unless there is drainage.  If the epidural site drains for more than 36-48 hours please call the anesthesia department.  QUESTIONS?:  Please feel free to call your physician or the hospital  operator if you have any questions, and they will be happy to assist you.

## 2013-06-12 ENCOUNTER — Encounter (HOSPITAL_COMMUNITY)
Admission: RE | Admit: 2013-06-12 | Discharge: 2013-06-12 | Disposition: A | Discharge status: Home / Self Care | Payer: Medicare Other | Source: Ambulatory Visit | Attending: Urology | Admitting: Urology

## 2013-06-14 ENCOUNTER — Encounter (HOSPITAL_COMMUNITY)
Admission: RE | Admit: 2013-06-14 | Discharge: 2013-06-14 | Disposition: A | Discharge status: Home / Self Care | Payer: Medicare Other | Source: Ambulatory Visit | Attending: Urology | Admitting: Urology

## 2013-06-18 ENCOUNTER — Encounter (HOSPITAL_COMMUNITY): Admission: RE | Payer: Self-pay | Source: Ambulatory Visit

## 2013-06-18 ENCOUNTER — Ambulatory Visit (HOSPITAL_COMMUNITY): Admission: RE | Admit: 2013-06-18 | Payer: Medicare Other | Source: Ambulatory Visit | Admitting: Urology

## 2013-06-18 SURGERY — CYSTOSCOPY, WITH CALCULUS REMOVAL USING BASKET
Anesthesia: Choice | Laterality: Right

## 2014-01-28 ENCOUNTER — Telehealth: Payer: Self-pay | Admitting: Family Medicine

## 2014-02-05 NOTE — Telephone Encounter (Signed)
Several attempts have been made to contact patient back in response to becoming a new patient. We will not be able to take him on at this time. This encounter will be closed.

## 2014-03-24 ENCOUNTER — Ambulatory Visit: Payer: Self-pay | Admitting: Family Medicine

## 2014-04-02 ENCOUNTER — Ambulatory Visit (INDEPENDENT_AMBULATORY_CARE_PROVIDER_SITE_OTHER): Payer: Medicare Other | Admitting: Family Medicine

## 2014-04-02 ENCOUNTER — Encounter: Payer: Self-pay | Admitting: Family Medicine

## 2014-04-02 VITALS — BP 126/68 | HR 76 | Temp 98.8°F | Resp 18 | Ht 67.0 in | Wt 340.0 lb

## 2014-04-02 DIAGNOSIS — J353 Hypertrophy of tonsils with hypertrophy of adenoids: Secondary | ICD-10-CM | POA: Insufficient documentation

## 2014-04-02 DIAGNOSIS — G4733 Obstructive sleep apnea (adult) (pediatric): Secondary | ICD-10-CM

## 2014-04-02 DIAGNOSIS — R0981 Nasal congestion: Secondary | ICD-10-CM

## 2014-04-02 DIAGNOSIS — G8929 Other chronic pain: Secondary | ICD-10-CM

## 2014-04-02 DIAGNOSIS — E669 Obesity, unspecified: Secondary | ICD-10-CM

## 2014-04-02 DIAGNOSIS — K219 Gastro-esophageal reflux disease without esophagitis: Secondary | ICD-10-CM

## 2014-04-02 MED ORDER — OMEPRAZOLE 20 MG PO CPDR: 20.0000 mg | DELAYED_RELEASE_CAPSULE | Freq: Every day | ORAL | Status: DC

## 2014-04-02 NOTE — Progress Notes (Signed)
Patient ID: Michael Tate, male   DOB: [birth year], 50 y.o.   MRN: [id number]   Subjective:    Patient ID: Michael Tate, male    DOB: November 12, 1965, 49 y.o.   MRN: [id number]  Patient presents for R Ear Hearing Loss; Illness; Indigestion; Referral; and Orders for CPAP  patient here to reestablish care he was last seen by me about 2 years ago. At that time he left after I refuse to send him to another pain management physician as he failed multiple pain contract including the one with myself. He is asking for another pain referral today and I've declined this I told him that I will follow his other medical problems but I will not address his pain in any way.  He is still being followed by psychiatry- [facility] - Dr. Jeanell Sparrow  He was seen by Dr. Irene Shipper office in [place] and he states he was prescribed something for acid reflux that he wanted refills initially he states that they just will not refill it and dismissed him he then owned up to the fact that he missed multiple appointments in the dismissed him from their office. After reviewing the pharmacy he is on omeprazole 20 mg  He also requests a new C Pap mask he has difficulty with the nasal prongs he feels like his nose is very congested and this is been worsening over the past few weeks. He denies any significant cough. He does continue to snore. He also gets a popping sensation recurrently in his right ear which has been going on for quite some time.  Review Of Systems:  GEN- denies fatigue, fever, weight loss,weakness, recent illness HEENT- denies eye drainage, change in vision, +nasal discharge, CVS- denies chest pain, palpitations RESP- denies SOB, cough, wheeze ABD- denies N/V, change in stools, abd pain GU- denies dysuria, hematuria, dribbling, incontinence MSK- +joint pain, muscle aches, injury Neuro- denies headache, dizziness, syncope, seizure activity       Objective:    BP 126/68 mmHg  Pulse 76  Temp(Src) 98.8 F (37.1 C)  (Oral)  Resp 18  Ht 5\' 7"  (1.702 m)  Wt 340 lb (154.223 kg)  BMI 53.24 kg/m2 GEN- NAD, alert and oriented x3 HEENT- PERRL, EOMI, non injected sclera, pink conjunctiva, MMM, oropharynx clear, enlarged tonsils-kissing uvula, nares, clear rhinorrhea, enlarged turbinates, bilat TM clear, canals clear Neck- Supple, large neck circumference, no LAD CVS- RRR, no murmur RESP-CTAB EXT- No edema Pulses- Radial 2+        Assessment & Plan:      Problem List Items Addressed This Visit    None      Note: This dictation was prepared with Dragon dictation along with smaller phrase technology. Any transcriptional errors that result from this process are unintentional.

## 2014-04-02 NOTE — Assessment & Plan Note (Addendum)
Would benefit from weight loss, dietary changes Needs CPE and fasting labs

## 2014-04-02 NOTE — Assessment & Plan Note (Signed)
Trial of flonase

## 2014-04-02 NOTE — Assessment & Plan Note (Signed)
Would benefit from removal , send to ENT

## 2014-04-02 NOTE — Patient Instructions (Addendum)
Release or records- Dr. Wenda Overland office in [place] for your sinuses Referral to ENT [facility] for CPAP Mask F/U for physical come fasting

## 2014-04-02 NOTE — Assessment & Plan Note (Signed)
New CPAP face mask ordered

## 2014-04-02 NOTE — Assessment & Plan Note (Signed)
Prilosec restarted

## 2014-04-02 NOTE — Assessment & Plan Note (Signed)
Please see chart from our last discussion regarding this Multiple failed pain contracts I will not address his pain, he is aware of this

## 2014-04-09 ENCOUNTER — Encounter: Payer: Self-pay | Admitting: *Deleted

## 2014-04-30 ENCOUNTER — Encounter: Payer: Self-pay | Admitting: Family Medicine

## 2014-05-01 ENCOUNTER — Ambulatory Visit (INDEPENDENT_AMBULATORY_CARE_PROVIDER_SITE_OTHER): Payer: Medicare Other | Admitting: Otolaryngology

## 2014-06-02 ENCOUNTER — Encounter: Payer: Self-pay | Admitting: Family Medicine

## 2014-06-02 ENCOUNTER — Ambulatory Visit (INDEPENDENT_AMBULATORY_CARE_PROVIDER_SITE_OTHER): Payer: Medicare Other | Admitting: Family Medicine

## 2014-06-02 VITALS — BP 138/78 | HR 76 | Temp 97.9°F | Resp 18 | Ht 67.0 in | Wt 338.0 lb

## 2014-06-02 DIAGNOSIS — N200 Calculus of kidney: Secondary | ICD-10-CM

## 2014-06-02 LAB — URINALYSIS, ROUTINE W REFLEX MICROSCOPIC
BILIRUBIN URINE: NEGATIVE
Glucose, UA: NEGATIVE mg/dL
Ketones, ur: NEGATIVE mg/dL
Nitrite: NEGATIVE
PROTEIN: 100 mg/dL — AB
Specific Gravity, Urine: >1.03 — ABNORMAL HIGH (ref 1.005–1.030)
UROBILINOGEN UA: 1 mg/dL (ref 0.0–1.0)
pH: 6.5 (ref 5.0–8.0)

## 2014-06-02 LAB — URINALYSIS, MICROSCOPIC ONLY
CRYSTALS: NONE SEEN
Casts: NONE SEEN

## 2014-06-02 MED ORDER — TAMSULOSIN HCL 0.4 MG PO CAPS: 0.4000 mg | ORAL_CAPSULE | Freq: Every day | ORAL | Status: DC

## 2014-06-02 MED ORDER — OXYCODONE-ACETAMINOPHEN 7.5-325 MG PO TABS: 1.0000 | ORAL_TABLET | Freq: Four times a day (QID) | ORAL | Status: DC | PRN

## 2014-06-02 NOTE — Progress Notes (Signed)
Patient ID: Michael Tate, male   DOB: [birth year], 49 y.o.   MRN: [id number]   Subjective:    Patient ID: Michael Tate, male    DOB: [birth year], 49 y.o.   MRN: [id number]  Patient presents for Kidney Joaquim Lai, R Side     Pt here with right lower back pain that radiates toward groin for past few days, Thursday night started with back pain. History of kidney stones, was being followed by Dr. Michela Pitcher in the past but his office is now closed. Denies N/V, no change in stools, + hematuria, no fever  He thinks he may have passed a small piece of something but not sure   Review Of Systems:  GEN- denies fatigue, fever, weight loss,weakness, recent illness HEENT- denies eye drainage, change in vision, nasal discharge, CVS- denies chest pain, palpitations RESP- denies SOB, cough, wheeze ABD- denies N/V, change in stools, abd pain GU- denies dysuria, +hematuria, dribbling, incontinence MSK- denies joint pain, muscle aches, injury Neuro- denies headache, dizziness, syncope, seizure activity       Objective:    BP 138/78 mmHg  Pulse 76  Temp(Src) 97.9 F (36.6 C) (Oral)  Resp 18  Ht 5\' 7"  (1.702 m)  Wt 338 lb (153.316 kg)  BMI 52.93 kg/m2 GEN- NAD, alert and oriented x3 CVS- RRR, no murmur RESP-CTAB ABD-NABS,soft, mild TTP RLQ, no rebound, no gaurding,ND, + Right CVA tenderness, MSK- Spine NT EXT- No edema Pulses- Radial 2+        Assessment & Plan:      Problem List Items Addressed This Visit    None    Visit Diagnoses    Kidney stone on right side    -  Primary    Concern for probable kideny stone, he has large blood in urine,with protein, leuks, obtain KUB, start flomax, given #20 tabs of percocet. He will need a new urologist. I have many concerns over giving him pain meds, but in the case of acute illness where I feel there is enough information to justify probable stone, he had CT done in 2015 which showed stone on right side as well.    Relevant Medications    PERCOCET 7.5-325 MG PO TABS    Other Relevant Orders    Urinalysis, Routine w reflex microscopic (Completed)    DG Abd 1 View    Urine culture       Note: This dictation was prepared with Dragon dictation along with smaller phrase technology. Any transcriptional errors that result from this process are unintentional.

## 2014-06-02 NOTE — Patient Instructions (Signed)
Take the flomax until seen by urology Take pain medication as prescribed- NO REFILLS  Referral to new urologist Get the xray done at [facility]

## 2014-06-03 ENCOUNTER — Ambulatory Visit (HOSPITAL_COMMUNITY)
Admission: RE | Admit: 2014-06-03 | Discharge: 2014-06-03 | Disposition: A | Discharge status: Home / Self Care | Payer: Medicare Other | Source: Ambulatory Visit | Attending: Family Medicine | Admitting: Family Medicine

## 2014-06-03 DIAGNOSIS — R109 Unspecified abdominal pain: Secondary | ICD-10-CM | POA: Diagnosis not present

## 2014-06-03 DIAGNOSIS — R110 Nausea: Secondary | ICD-10-CM | POA: Insufficient documentation

## 2014-06-03 DIAGNOSIS — N200 Calculus of kidney: Secondary | ICD-10-CM

## 2014-06-03 DIAGNOSIS — R319 Hematuria, unspecified: Secondary | ICD-10-CM | POA: Insufficient documentation

## 2014-06-04 LAB — URINE CULTURE: Colony Count: 60000

## 2014-07-16 ENCOUNTER — Ambulatory Visit: Payer: Medicare Other | Admitting: Family Medicine

## 2014-07-18 ENCOUNTER — Ambulatory Visit: Payer: Medicare Other | Admitting: Family Medicine

## 2014-08-07 ENCOUNTER — Other Ambulatory Visit: Payer: Self-pay | Admitting: Family Medicine

## 2014-10-17 ENCOUNTER — Ambulatory Visit (INDEPENDENT_AMBULATORY_CARE_PROVIDER_SITE_OTHER): Payer: Medicare Other | Admitting: Family Medicine

## 2014-10-17 ENCOUNTER — Encounter: Payer: Self-pay | Admitting: Family Medicine

## 2014-10-17 VITALS — BP 136/82 | HR 88 | Temp 98.2°F | Resp 20 | Ht 67.0 in | Wt 347.0 lb

## 2014-10-17 DIAGNOSIS — M25551 Pain in right hip: Secondary | ICD-10-CM | POA: Diagnosis not present

## 2014-10-17 DIAGNOSIS — E669 Obesity, unspecified: Secondary | ICD-10-CM

## 2014-10-17 DIAGNOSIS — Z1211 Encounter for screening for malignant neoplasm of colon: Secondary | ICD-10-CM | POA: Diagnosis not present

## 2014-10-17 NOTE — Patient Instructions (Signed)
Referral to colonoscopy  Get xray of hip Referral to [facility] F/U for Physical in 3 months - Come fasting

## 2014-10-19 NOTE — Assessment & Plan Note (Signed)
Obtain xrays, I have told him numerous times I will not address his chronic pain, see previous notes and above, will see if ortho will inject his hip. Obtain xrrays first. Referral placed

## 2014-10-19 NOTE — Progress Notes (Signed)
Patient ID: Michael Tate, male   DOB: 04/29/1965, 49 y.o.   MRN: [id number]   Subjective:    Patient ID: Michael Tate, male    DOB: [birth year], 49 y.o.   MRN: [id number]  Patient presents for Joint Pain and Referral  Pt here for referrals, history of chronic back and hip pain has been in multiple pain clinics, he failed drug screen at my office which resulted in him being sent to pain clinics, he was then discharged from those two, see previous notes. He wants referral to an orthopedist he feels like his right hip is getting worse and he has more pain with activities.  He wants to go back to [facility] as they did one of his surgeries.  Also wants to proceed with colonoscopy    Review Of Systems:  GEN- denies fatigue, fever, weight loss,weakness, recent illness HEENT- denies eye drainage, change in vision, nasal discharge, CVS- denies chest pain, palpitations RESP- denies SOB, cough, wheeze ABD- denies N/V, change in stools, abd pain GU- denies dysuria, hematuria, dribbling, incontinence MSK- + joint pain, muscle aches, injury Neuro- denies headache, dizziness, syncope, seizure activity       Objective:    BP 136/82 mmHg  Pulse 88  Temp(Src) 98.2 F (36.8 C) (Oral)  Resp 20  Ht 5\' 7"  (1.702 m)  Wt 347 lb (157.398 kg)  BMI 54.34 kg/m2 GEN- NAD, alert and oriented x3,obese MSK- Decreased ROM spine, large habitus, Pain with ROM bilat hips, non antalgic gait, strength equal bilat LE  EXT- No edema Pulses- Radial  2+        Assessment & Plan:      Problem List Items Addressed This Visit    Hip pain - Primary   Relevant Orders   DG HIP UNILAT WITH PELVIS 2-3 VIEWS RIGHT      Note: This dictation was prepared with Dragon dictation along with smaller phrase technology. Any transcriptional errors that result from this process are unintentional.

## 2014-10-19 NOTE — Assessment & Plan Note (Signed)
Continues to gain weight, yet he is only concerned about his "chronic pain" and medications

## 2014-10-24 ENCOUNTER — Encounter (INDEPENDENT_AMBULATORY_CARE_PROVIDER_SITE_OTHER): Payer: Self-pay | Admitting: *Deleted

## 2015-09-02 ENCOUNTER — Other Ambulatory Visit: Payer: Self-pay | Admitting: Family Medicine

## 2015-09-02 NOTE — Telephone Encounter (Signed)
Patient requires OV.   Refill denied.

## 2015-10-01 ENCOUNTER — Other Ambulatory Visit: Payer: Self-pay | Admitting: Family Medicine

## 2015-10-01 NOTE — Telephone Encounter (Signed)
Refill refused.   Requires office visit before any further refills can be given.  

## 2015-10-02 ENCOUNTER — Other Ambulatory Visit: Payer: Self-pay | Admitting: Family Medicine

## 2015-10-02 NOTE — Telephone Encounter (Signed)
Refill refused: Patient needs appointment.

## 2015-10-27 ENCOUNTER — Other Ambulatory Visit: Payer: Self-pay | Admitting: Family Medicine

## 2015-12-25 ENCOUNTER — Other Ambulatory Visit: Payer: Self-pay | Admitting: Family Medicine

## 2016-02-01 ENCOUNTER — Other Ambulatory Visit: Payer: Self-pay | Admitting: Family Medicine

## 2016-02-11 ENCOUNTER — Other Ambulatory Visit: Payer: Self-pay | Admitting: Family Medicine

## 2016-02-17 ENCOUNTER — Other Ambulatory Visit: Payer: Self-pay | Admitting: Family Medicine

## 2016-02-17 NOTE — Telephone Encounter (Signed)
Requires office visit before any further refills can be given.  

## 2016-03-22 ENCOUNTER — Encounter (HOSPITAL_COMMUNITY): Payer: Self-pay

## 2016-03-22 ENCOUNTER — Emergency Department (HOSPITAL_COMMUNITY): Payer: Medicare HMO

## 2016-03-22 ENCOUNTER — Emergency Department (HOSPITAL_COMMUNITY)
Admission: EM | Admit: 2016-03-22 | Discharge: 2016-03-22 | Disposition: A | Discharge status: Home / Self Care | Payer: Medicare HMO | Attending: Emergency Medicine | Admitting: Emergency Medicine

## 2016-03-22 DIAGNOSIS — R600 Localized edema: Secondary | ICD-10-CM | POA: Diagnosis not present

## 2016-03-22 DIAGNOSIS — Z79899 Other long term (current) drug therapy: Secondary | ICD-10-CM | POA: Insufficient documentation

## 2016-03-22 DIAGNOSIS — R002 Palpitations: Secondary | ICD-10-CM

## 2016-03-22 DIAGNOSIS — R0602 Shortness of breath: Secondary | ICD-10-CM | POA: Diagnosis not present

## 2016-03-22 DIAGNOSIS — F17210 Nicotine dependence, cigarettes, uncomplicated: Secondary | ICD-10-CM | POA: Diagnosis not present

## 2016-03-22 LAB — I-STAT TROPONIN, ED: TROPONIN I, POC: 0 ng/mL (ref 0.00–0.08)

## 2016-03-22 LAB — BASIC METABOLIC PANEL
ANION GAP: 9 (ref 5–15)
BUN: 10 mg/dL (ref 6–20)
CALCIUM: 9.2 mg/dL (ref 8.9–10.3)
CO2: 30 mmol/L (ref 22–32)
Chloride: 96 mmol/L — ABNORMAL LOW (ref 101–111)
Creatinine, Ser: 0.74 mg/dL (ref 0.61–1.24)
GFR calc Af Amer: >60 mL/min (ref >60)
GLUCOSE: 91 mg/dL (ref 65–99)
POTASSIUM: 3.7 mmol/L (ref 3.5–5.1)
SODIUM: 135 mmol/L (ref 135–145)

## 2016-03-22 LAB — CBC
HEMATOCRIT: 47.5 % (ref 39.0–52.0)
Hemoglobin: 16.1 g/dL (ref 13.0–17.0)
MCH: 31.4 pg (ref 26.0–34.0)
MCHC: 33.9 g/dL (ref 30.0–36.0)
MCV: 92.8 fL (ref 78.0–100.0)
Platelets: 307 10*3/uL (ref 150–400)
RBC: 5.12 MIL/uL (ref 4.22–5.81)
RDW: 16 % — ABNORMAL HIGH (ref 11.5–15.5)
WBC: 15.8 10*3/uL — AB (ref 4.0–10.5)

## 2016-03-22 LAB — BRAIN NATRIURETIC PEPTIDE: B NATRIURETIC PEPTIDE 5: 33 pg/mL (ref 0.0–100.0)

## 2016-03-22 NOTE — ED Notes (Signed)
Pt left before paper work printed, Per tech and previous Rn, pt has already pulled out IV
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
## 2016-03-22 NOTE — ED Triage Notes (Signed)
Pt reports that he has been having heart palpitations for 5 days and lower ext. Edema. Noted that he has been drinking approx 6 beers per day and adding salt to each beer for the last 2 1/2

## 2016-04-01 ENCOUNTER — Other Ambulatory Visit: Payer: Self-pay | Admitting: Family Medicine

## 2016-04-04 ENCOUNTER — Other Ambulatory Visit: Payer: Self-pay | Admitting: Family Medicine

## 2016-04-06 ENCOUNTER — Encounter: Payer: Self-pay | Admitting: Family Medicine

## 2016-04-06 ENCOUNTER — Ambulatory Visit (INDEPENDENT_AMBULATORY_CARE_PROVIDER_SITE_OTHER): Payer: Medicare HMO | Admitting: Family Medicine

## 2016-04-06 VITALS — BP 138/80 | HR 78 | Temp 98.5°F | Resp 18 | Ht 67.0 in | Wt 370.0 lb

## 2016-04-06 DIAGNOSIS — R609 Edema, unspecified: Secondary | ICD-10-CM | POA: Diagnosis not present

## 2016-04-06 DIAGNOSIS — R7302 Impaired glucose tolerance (oral): Secondary | ICD-10-CM

## 2016-04-06 DIAGNOSIS — B353 Tinea pedis: Secondary | ICD-10-CM

## 2016-04-06 DIAGNOSIS — I517 Cardiomegaly: Secondary | ICD-10-CM

## 2016-04-06 DIAGNOSIS — R002 Palpitations: Secondary | ICD-10-CM | POA: Diagnosis not present

## 2016-04-06 DIAGNOSIS — J029 Acute pharyngitis, unspecified: Secondary | ICD-10-CM

## 2016-04-06 MED ORDER — CEPHALEXIN 500 MG PO CAPS: 500.0000 mg | ORAL_CAPSULE | Freq: Two times a day (BID) | ORAL | 0 refills | Status: DC

## 2016-04-06 MED ORDER — OMEPRAZOLE 20 MG PO CPDR: 20.0000 mg | DELAYED_RELEASE_CAPSULE | Freq: Every day | ORAL | 6 refills | Status: DC

## 2016-04-06 MED ORDER — HYDROCHLOROTHIAZIDE 25 MG PO TABS: 25.0000 mg | ORAL_TABLET | Freq: Every day | ORAL | 6 refills | Status: DC

## 2016-04-06 MED ORDER — CLOTRIMAZOLE-BETAMETHASONE 1-0.05 % EX CREA: 1.0000 "application " | TOPICAL_CREAM | Freq: Two times a day (BID) | CUTANEOUS | 1 refills | Status: DC

## 2016-04-06 NOTE — Progress Notes (Signed)
Subjective:    Patient ID: Michael Tate, male    DOB: 10-Jun-1965, 51 y.o.   MRN: [id number]  Patient presents for Medication Management; Edema (swellign to BLE); and Palpitations (states that he feels like he can't control his HR) Patient here for follow-up from the emergency room. He was seen in the ER about a week ago after having palpitations and swelling on and off for the past 6 months he also had episodes of shortness of breath. this was thought to be secondary to his alcohol intake even adding salt to his alcohol may shrink in about 6-8 beers a night. Chest x-ray showed stable cardiomegaly his labs were unremarkable except for an elevated white blood cell count 15. He did not have her liver function study done His EKG showed sinus rhythm with incomplete right bundle branch block. His medications were reviewed  His last visit at our office was in August 20 16th His Xanax/Celexa  [facility], Reviewed [place] controlled substance reporting site  He is on prilosec for GERD out of script  He is still having difficulty with palpitations and feeling short of breath. His weight is up 25 pounds since I last saw him in 2016. He's not had any chest pain per se. He admits that he has been depressed which is why he started eating he had a recent change to the Celexa and this has helped. He states he has also been taking a diet pill from the Internet. When asked about his white blood cell count he states he has had sore throat for the past week pain when he swallows. But he also has redness on his feet states that he has cuts on them and he knows that he has some fungus  Note he has stopped drinking  Review Of Systems:  GEN- denies fatigue, fever, weight loss,weakness, recent illness HEENT- denies eye drainage, change in vision, nasal discharge, CVS- denies chest pain,+ palpitations RESP- + SOB, cough, wheeze ABD- denies N/V, change in stools, abd  pain GU- denies dysuria, hematuria, dribbling, incontinence MSK- denies joint pain, muscle aches, injury Neuro- denies headache, dizziness, syncope, seizure activity       Objective:    BP 138/80   Pulse 78   Temp 98.5 F (36.9 C) (Oral)   Resp 18   Ht 5\' 7"  (1.702 m)   Wt (!) 370 lb (167.8 kg)   BMI 57.95 kg/m  GEN- NAD, alert and oriented x3,obese  HEENT- PERRL, EOMI, non injected sclera, pink conjunctiva, MMM, oropharynx injected no exuates  Neck- Supple, no LAD , no JVD  CVS- RRR, no murmur RESP-CTAB ABD-NABS,soft,NT,ND,large abodmen , no fluid wave appreciated  EXT- pedal edema pitting, erythema bilat feet, maceartion, peeling crackign skin, no discrete abscess  Pulses- Radial, DP- 2+  EKG-NSR, Incomplete RBB       Assessment & Plan:      Problem List Items Addressed This Visit    Morbid obesity ([place])    Morbid obesity, contribute to symptoms per above. I do not recommend over-the-counter weight loss medication. I would not recommend any kind of stimulant with him because of his mental health in his medications. This also takes out the Contrave Due to the wellbutrin. We discussed injectable but he does not want to use any type of injections. Medicine him over to the seminar for bariatric surgery.      Glucose intolerance (impaired glucose tolerance)   Relevant Orders   Hemoglobin A1c  Other Visit Diagnoses    Palpitations    -  Primary   EKG shows Incomplete RBB, cadiomegaly mild volumen overload, discussed stopping the diet pill this may be causing issues, his morbid obesity also contributing t   Relevant Orders   EKG 12-Lead (Completed)   Comprehensive metabolic panel   CBC with Differential/Platelet   Peripheral edema       Start HCTZ , check LFT    Relevant Orders   TSH   Cardiomegaly       Obtain Echo    Relevant Medications   hydrochlorothiazide (HYDRODIURIL) 25 MG tablet   Pharyngitis, unspecified etiology       Start antibiotics, for this and  will cover any superinfection in his feet, Keflex    Tinea pedis of both feet       topical anti-fungal discussed hygiene   Relevant Medications   cephALEXin (KEFLEX) 500 MG capsule   clotrimazole-betamethasone (LOTRISONE) cream      Note: This dictation was prepared with Dragon dictation along with smaller phrase technology. Any transcriptional errors that result from this process are unintentional.

## 2016-04-06 NOTE — Assessment & Plan Note (Signed)
Morbid obesity, contribute to symptoms per above. I do not recommend over-the-counter weight loss medication. I would not recommend any kind of stimulant with him because of his mental health in his medications. This also takes out the Contrave Due to the wellbutrin. We discussed injectable but he does not want to use any type of injections. Medicine him over to the seminar for bariatric surgery.

## 2016-04-06 NOTE — Patient Instructions (Addendum)
Referral to bariatric surgeon Call  [phone] to sign up for the weight loss Semninar, you must do this before you see the surgeon STOP THE WEIGHT LOSS MEDICATION Take the HCTZ fluid pill once a day  Ultrasound of heart to be done  Take antibiotics  Use the cream on your feet F/U 2 weeks

## 2016-04-07 LAB — CBC WITH DIFFERENTIAL/PLATELET
BASOS PCT: 0 %
Basophils Absolute: 0 cells/uL (ref 0–200)
EOS PCT: 2 %
Eosinophils Absolute: 264 cells/uL (ref 15–500)
HEMATOCRIT: 45.9 % (ref 38.5–50.0)
HEMOGLOBIN: 15.4 g/dL (ref 13.0–17.0)
LYMPHS ABS: 3300 {cells}/uL (ref 850–3900)
Lymphocytes Relative: 25 %
MCH: 31.2 pg (ref 27.0–33.0)
MCHC: 33.6 g/dL (ref 32.0–36.0)
MCV: 93.1 fL (ref 80.0–100.0)
MONO ABS: 792 {cells}/uL (ref 200–950)
MPV: 9.6 fL (ref 7.5–12.5)
Monocytes Relative: 6 %
NEUTROS ABS: 8844 {cells}/uL — AB (ref 1500–7800)
NEUTROS PCT: 67 %
Platelets: 272 10*3/uL (ref 140–400)
RBC: 4.93 MIL/uL (ref 4.20–5.80)
RDW: 15.8 % — ABNORMAL HIGH (ref 11.0–15.0)
WBC: 13.2 10*3/uL — AB (ref 3.8–10.8)

## 2016-04-07 LAB — COMPREHENSIVE METABOLIC PANEL
ALBUMIN: 4 g/dL (ref 3.6–5.1)
ALK PHOS: 76 U/L (ref 40–115)
ALT: 48 U/L — AB (ref 9–46)
AST: 33 U/L (ref 10–35)
BUN: 13 mg/dL (ref 7–25)
CALCIUM: 9.1 mg/dL (ref 8.6–10.3)
CHLORIDE: 101 mmol/L (ref 98–110)
CO2: 27 mmol/L (ref 20–31)
Creat: 0.74 mg/dL (ref 0.70–1.33)
Glucose, Bld: 83 mg/dL (ref 70–99)
POTASSIUM: 4.4 mmol/L (ref 3.5–5.3)
Sodium: 138 mmol/L (ref 135–146)
TOTAL PROTEIN: 6.8 g/dL (ref 6.1–8.1)
Total Bilirubin: 0.5 mg/dL (ref 0.2–1.2)

## 2016-04-07 LAB — HEMOGLOBIN A1C
HEMOGLOBIN A1C: 5.2 % (ref <5.7)
MEAN PLASMA GLUCOSE: 103 mg/dL

## 2016-04-07 LAB — TSH: TSH: 1.03 m[IU]/L (ref 0.40–4.50)

## 2016-04-12 ENCOUNTER — Ambulatory Visit (HOSPITAL_COMMUNITY)
Admission: RE | Admit: 2016-04-12 | Discharge: 2016-04-12 | Disposition: A | Discharge status: Home / Self Care | Payer: Medicare HMO | Source: Ambulatory Visit | Attending: Family Medicine | Admitting: Family Medicine

## 2016-04-12 DIAGNOSIS — R609 Edema, unspecified: Secondary | ICD-10-CM | POA: Diagnosis present

## 2016-04-12 DIAGNOSIS — I517 Cardiomegaly: Secondary | ICD-10-CM

## 2016-04-12 MED ORDER — PERFLUTREN LIPID MICROSPHERE
1.0000 mL | INTRAVENOUS | Status: AC | PRN
  Administered 2016-04-12: 2 mL via INTRAVENOUS
  Filled 2016-04-12: qty 10

## 2016-04-12 NOTE — Progress Notes (Signed)
*  PRELIMINARY RESULTS* Echocardiogram 2D Echocardiogram with definity has been performed.  Leavy Cella 04/12/2016, 12:57 PM

## 2016-04-20 ENCOUNTER — Ambulatory Visit (INDEPENDENT_AMBULATORY_CARE_PROVIDER_SITE_OTHER): Payer: Medicare HMO | Admitting: Family Medicine

## 2016-04-20 ENCOUNTER — Encounter: Payer: Self-pay | Admitting: Family Medicine

## 2016-04-20 VITALS — BP 138/88 | HR 66 | Temp 98.4°F | Resp 18 | Ht 67.0 in | Wt 368.0 lb

## 2016-04-20 DIAGNOSIS — R7302 Impaired glucose tolerance (oral): Secondary | ICD-10-CM | POA: Diagnosis not present

## 2016-04-20 DIAGNOSIS — G4733 Obstructive sleep apnea (adult) (pediatric): Secondary | ICD-10-CM

## 2016-04-20 DIAGNOSIS — Z1211 Encounter for screening for malignant neoplasm of colon: Secondary | ICD-10-CM

## 2016-04-20 DIAGNOSIS — Z0000 Encounter for general adult medical examination without abnormal findings: Secondary | ICD-10-CM

## 2016-04-20 DIAGNOSIS — R053 Chronic cough: Secondary | ICD-10-CM

## 2016-04-20 DIAGNOSIS — R05 Cough: Secondary | ICD-10-CM | POA: Diagnosis not present

## 2016-04-20 DIAGNOSIS — Z720 Tobacco use: Secondary | ICD-10-CM | POA: Diagnosis not present

## 2016-04-20 LAB — CBC WITH DIFFERENTIAL/PLATELET
BASOS PCT: 0 %
Basophils Absolute: 0 cells/uL (ref 0–200)
EOS PCT: 1 %
Eosinophils Absolute: 153 cells/uL (ref 15–500)
HCT: 46.9 % (ref 38.5–50.0)
Hemoglobin: 16.2 g/dL (ref 13.0–17.0)
LYMPHS PCT: 21 %
Lymphs Abs: 3213 cells/uL (ref 850–3900)
MCH: 32.1 pg (ref 27.0–33.0)
MCHC: 34.5 g/dL (ref 32.0–36.0)
MCV: 92.9 fL (ref 80.0–100.0)
MONO ABS: 918 {cells}/uL (ref 200–950)
MONOS PCT: 6 %
MPV: 9.4 fL (ref 7.5–12.5)
Neutro Abs: 11016 cells/uL — ABNORMAL HIGH (ref 1500–7800)
Neutrophils Relative %: 72 %
PLATELETS: 338 10*3/uL (ref 140–400)
RBC: 5.05 MIL/uL (ref 4.20–5.80)
RDW: 15.6 % — ABNORMAL HIGH (ref 11.0–15.0)
WBC: 15.3 10*3/uL — AB (ref 3.8–10.8)

## 2016-04-20 LAB — LIPID PANEL
CHOLESTEROL: 220 mg/dL — AB (ref <200)
HDL: 39 mg/dL — AB (ref >40)
LDL Cholesterol: 134 mg/dL — ABNORMAL HIGH (ref <100)
TRIGLYCERIDES: 234 mg/dL — AB (ref <150)
Total CHOL/HDL Ratio: 5.6 Ratio — ABNORMAL HIGH (ref <5.0)
VLDL: 47 mg/dL — ABNORMAL HIGH (ref <30)

## 2016-04-20 NOTE — Patient Instructions (Addendum)
[email or web address] We will call with lab results Lung function test F/U 4 months

## 2016-04-20 NOTE — Progress Notes (Signed)
Subjective:   Patient presents for Medicare Annual/Subsequent preventive examination.  he here for an goal wellness as well as follow-up on his visit from a couple weeks ago where he was seen in the emergency room with palpitations and swelling. He been increasing on alcohol and adding salt to the alcohol which resulted in fluid retention and some shortness of breath. He had an EKG done which showed incomplete) bundle-branch block. Was also taking some type of over-the-counter weight loss supplement. During that visit he was also treated for pharyngitis she is also treated for tinea pedis Was referred to bariatric clinic he is not a candidate for the weight loss medications due to his psychiatric meds and he did not want injectable. His weight at the last visit was 370 pounds His echocardiogram showed normal heart function but he did have some hypertrophy of the heart wall/LVH  Today his swelling is down with HCTZ He is contemplating the bariatic surgery seminar He continues to smoke, worried about chronic cough he has had, states he occasionally hears wheezing, had recent CXR    Review Past Medical/Family/Social: Per EMR    Risk Factors  Current exercise habits: None Dietary issues discussed: Yes   Cardiac risk factors: Obesity (BMI >= 30 kg/m2). Glucose intolerance   Depression Screen  (Note: if answer to either of the following is "Yes", a more complete depression screening is indicated)  Over the past two weeks, have you felt down, depressed or hopeless? No Over the past two weeks, have you felt little interest or pleasure in doing things? No Have you lost interest or pleasure in daily life? No Do you often feel hopeless? No Do you cry easily over simple problems? No   Activities of Daily Living  In your present state of health, do you have any difficulty performing the following activities?:  Driving? No  Managing money? No  Feeding yourself? No  Getting from bed to chair? No   Climbing a flight of stairs? yes  Preparing food and eating?: No  Bathing or showering? No  Getting dressed: No  Getting to the toilet? No  Using the toilet:No  Moving around from place to place: yes  In the past year have you fallen or had a near fall?:No  Are you sexually active? No  Do you have more than one partner? No   Hearing Difficulties: yes  Do you often ask people to speak up or repeat themselves? No  Do you experience ringing or noises in your ears? No Do you have difficulty understanding soft or whispered voices? yes  Do you feel that you have a problem with memory? No Do you often misplace items? No  Do you feel safe at home? Yes  Cognitive Testing  Alert? Yes Normal Appearance?Yes  Oriented to person? Yes Place? Yes  Time? Yes  Recall of three objects? Yes  Can perform simple calculations? Yes  Displays appropriate judgment?Yes  Can read the correct time from a watch face?Yes   List the Names of Other Physician/Practitioners you currently use:   Psychiatry- [place]   Screening Tests / Date Colonoscopy  Due                   Zostavax  Age 51 Influenza Vaccine  Declines  Tetanus/tdap UTD Pneumonia- Due Smoker     ROS: GEN- denies fatigue, fever, weight loss,weakness, recent illness HEENT- denies eye drainage, change in vision, nasal discharge, CVS- denies chest pain, palpitations RESP- denies SOB,+ cough,  wheeze ABD- denies N/V, change in stools, abd pain GU- denies dysuria, hematuria, dribbling, incontinence MSK- denies joint pain, muscle aches, injury Neuro- denies headache, dizziness, syncope, seizure activity      Physical: GEN- NAD, alert and oriented x3,obese  HEENT- PERRL, EOMI, non injected sclera, pink conjunctiva, MMM, oropharynx injected no exuates  Neck- Supple, no LAD , no JVD  CVS- RRR, no murmur RESP-occasonal expiratory wheeze  ABD-NABS,soft,NT,ND,large abodmen  EXT- trace edema Pulses- Radial, DP-  2+  Assessment:    Annual wellness medicare exam   Plan:    During the course of the visit the patient was educated and counseled about appropriate screening and preventive services including:    Screen + for depression. PHQ- 7 score of 12 currently followed by psychiatry  Needs Pneumonia vaccine  Concern for possible COPD, xray did not show a lot of hyperinflation, but he smokes and has symptoms so will obtain PFT. He is using gum to help him quit smoking    Referral for colonoscopy   Discussed diet and concentrating on that, he went back to the past where he states when he had pain pills he moved around more and he felt better, I have already had discussion with him, I will NOT Prescribe him chronic narcotics again.    Diet review for nutrition referral? Yes _X___ Not Indicated  - Already referred to weight loss surgeon  Patient Instructions (the written plan) was given to the patient.  Medicare Attestation  I have personally reviewed:  The patient's medical and social history  Their use of alcohol, tobacco or illicit drugs  Their current medications and supplements  The patient's functional ability including ADLs,fall risks, home safety risks, cognitive, and hearing and visual impairment  Diet and physical activities  Evidence for depression or mood disorders  The patient's weight, height, BMI, and visual acuity have been recorded in the chart. I have made referrals, counseling, and provided education to the patient based on review of the above and I have provided the patient with a written personalized care plan for preventive services.

## 2016-04-25 ENCOUNTER — Encounter (INDEPENDENT_AMBULATORY_CARE_PROVIDER_SITE_OTHER): Payer: Self-pay | Admitting: *Deleted

## 2016-05-04 ENCOUNTER — Other Ambulatory Visit (INDEPENDENT_AMBULATORY_CARE_PROVIDER_SITE_OTHER): Payer: Self-pay | Admitting: *Deleted

## 2016-05-04 DIAGNOSIS — Z1211 Encounter for screening for malignant neoplasm of colon: Secondary | ICD-10-CM

## 2016-05-11 ENCOUNTER — Ambulatory Visit (HOSPITAL_COMMUNITY)
Admission: RE | Admit: 2016-05-11 | Discharge: 2016-05-11 | Disposition: A | Discharge status: Home / Self Care | Payer: Medicare HMO | Source: Ambulatory Visit | Attending: Family Medicine | Admitting: Family Medicine

## 2016-05-11 DIAGNOSIS — R05 Cough: Secondary | ICD-10-CM | POA: Diagnosis present

## 2016-05-11 DIAGNOSIS — Z720 Tobacco use: Secondary | ICD-10-CM

## 2016-05-11 DIAGNOSIS — R053 Chronic cough: Secondary | ICD-10-CM

## 2016-05-11 LAB — PULMONARY FUNCTION TEST
DL/VA % pred: 91 %
DL/VA: 4.05 ml/min/mmHg/L
DLCO COR % PRED: 27 %
DLCO COR: 7.82 ml/min/mmHg
DLCO UNC: 7.82 ml/min/mmHg
DLCO unc % pred: 27 %
FEF 25-75 POST: 1.23 L/s
FEF 25-75 Pre: 1.18 L/sec
FEF2575-%Change-Post: 4 %
FEF2575-%PRED-PRE: 38 %
FEF2575-%Pred-Post: 39 %
FEV1-%Change-Post: 3 %
FEV1-%PRED-POST: 45 %
FEV1-%PRED-PRE: 44 %
FEV1-POST: 1.59 L
FEV1-Pre: 1.54 L
FEV1FVC-%Change-Post: -5 %
FEV1FVC-%Pred-Pre: 94 %
FEV6-%CHANGE-POST: 9 %
FEV6-%PRED-PRE: 48 %
FEV6-%Pred-Post: 52 %
FEV6-POST: 2.29 L
FEV6-Pre: 2.1 L
FEV6FVC-%PRED-POST: 104 %
FEV6FVC-%Pred-Pre: 104 %
FVC-%CHANGE-POST: 9 %
FVC-%Pred-Post: 50 %
FVC-%Pred-Pre: 46 %
FVC-POST: 2.29 L
FVC-Pre: 2.1 L
PRE FEV1/FVC RATIO: 73 %
Post FEV1/FVC ratio: 69 %
Post FEV6/FVC ratio: 100 %
Pre FEV6/FVC Ratio: 100 %
RV % pred: 183 %
RV: 3.53 L
TLC % pred: 88 %
TLC: 5.65 L

## 2016-05-11 MED ORDER — ALBUTEROL SULFATE (2.5 MG/3ML) 0.083% IN NEBU
2.5000 mg | INHALATION_SOLUTION | Freq: Once | RESPIRATORY_TRACT | Status: AC
  Administered 2016-05-11: 2.5 mg via RESPIRATORY_TRACT

## 2016-05-12 ENCOUNTER — Encounter: Payer: Self-pay | Admitting: *Deleted

## 2016-06-07 ENCOUNTER — Encounter: Payer: Self-pay | Admitting: Family Medicine

## 2016-06-07 ENCOUNTER — Ambulatory Visit (INDEPENDENT_AMBULATORY_CARE_PROVIDER_SITE_OTHER): Payer: Medicare HMO | Admitting: Family Medicine

## 2016-06-07 VITALS — BP 134/82 | HR 90 | Temp 98.2°F | Resp 18 | Ht 67.0 in | Wt 374.0 lb

## 2016-06-07 DIAGNOSIS — D489 Neoplasm of uncertain behavior, unspecified: Secondary | ICD-10-CM | POA: Diagnosis not present

## 2016-06-07 MED ORDER — LIRAGLUTIDE -WEIGHT MANAGEMENT 18 MG/3ML ~~LOC~~ SOPN: 0.6000 mg | PEN_INJECTOR | Freq: Every day | SUBCUTANEOUS | 2 refills | Status: DC

## 2016-06-07 NOTE — Progress Notes (Signed)
   Subjective:    Patient ID: Michael Tate, male    DOB: [birth year], 51 y.o.   MRN: [id number]  Patient presents for Non-Healing Wound (x2 months- L side of back- states that he has area that will not heal- states that it started out like a pimple, but never drained- has cut open)  Here with lesion to his left upper back. This has been present for the past 2 months. States it will not heal. He's tried shaving and off he has tried draining it like a pimple which she states it looked like initially but this does not help. He's used Neosporin on the wound as well. He denies any pain but sometimes it itches.  He would also like to go ahead and start weight loss medication. States that his diet is not working. We discussed Saxenda  Review Of Systems:  GEN- denies fatigue, fever, weight loss,weakness, recent illness HEENT- denies eye drainage, change in vision, nasal discharge, CVS- denies chest pain, palpitations RESP- denies SOB, cough, wheeze ABD- denies N/V, change in stools, abd pain GU- denies dysuria, hematuria, dribbling, incontinence MSK- denies joint pain, muscle aches, injury Neuro- denies headache, dizziness, syncope, seizure activity       Objective:    BP 134/82   Pulse 90   Temp 98.2 F (36.8 C) (Oral)   Resp 18   Ht 5\' 7"  (1.702 m)   Wt (!) 374 lb (169.6 kg)   SpO2 96%   BMI 58.58 kg/m  GEN- NAD, alert and oriented x3 Skin- Left upper back, 0.5 " circular callus lesion with hyperpigmentation at center Procedure- Shave biopsy  Procedure explained to patient questions answered benefits and risks discussed verbal consent obtained. Antiseptic-betadine  Anesthesia-lidocaine 1% with Epi  Razor blade used to shave lesion, beneath core seen with mild cystic material expressed Minimal blood loss, Drysol touched to lesion  persistent oozing Triple antibiotic applied and bandage Patient tolerated procedure well          Assessment & Plan:      Problem List  Items Addressed This Visit    Morbid obesity ([place])    Discussed Saxenda, pt shown how to inject medication in office F/U 6 weeks      Relevant Medications   Liraglutide -Weight Management ([place]) 18 MG/3ML SOPN    Other Visit Diagnoses    Neoplasm of uncertain behavior    -  Primary   Apperas to be a cyst underneath as core removed, send off for pathology   Relevant Orders   Pathology Report      Note: This dictation was prepared with Dragon dictation along with smaller phrase technology. Any transcriptional errors that result from this process are unintentional.

## 2016-06-07 NOTE — Patient Instructions (Signed)
F/U 6 weeks for Weight Saxenda Instructions  Week 1-  inject 0.6mg  daily  Week 2-  inject 1.2 mg daily  Week 3-  inject 1.8 mg daily  Week 4  inject 2.4mg  daily  Week 5  inject 3 mg daily

## 2016-06-07 NOTE — Assessment & Plan Note (Signed)
Discussed Saxenda, pt shown how to inject medication in office F/U 6 weeks

## 2016-06-08 LAB — PATHOLOGY

## 2016-06-24 ENCOUNTER — Encounter: Payer: Self-pay | Admitting: *Deleted

## 2016-06-29 ENCOUNTER — Other Ambulatory Visit: Payer: Self-pay | Admitting: Family Medicine

## 2016-06-29 ENCOUNTER — Other Ambulatory Visit: Payer: Self-pay | Admitting: *Deleted

## 2016-06-29 DIAGNOSIS — L989 Disorder of the skin and subcutaneous tissue, unspecified: Secondary | ICD-10-CM

## 2016-06-29 MED ORDER — LIRAGLUTIDE -WEIGHT MANAGEMENT 18 MG/3ML ~~LOC~~ SOPN: 0.6000 mg | PEN_INJECTOR | Freq: Every day | SUBCUTANEOUS | 2 refills | Status: DC

## 2016-06-29 MED ORDER — INSULIN PEN NEEDLE 32G X 6 MM MISC: 1 refills | Status: DC

## 2016-06-29 MED ORDER — OMEPRAZOLE 20 MG PO CPDR: 20.0000 mg | DELAYED_RELEASE_CAPSULE | Freq: Every day | ORAL | 1 refills | Status: DC

## 2016-06-30 ENCOUNTER — Telehealth (INDEPENDENT_AMBULATORY_CARE_PROVIDER_SITE_OTHER): Payer: Self-pay | Admitting: *Deleted

## 2016-06-30 ENCOUNTER — Encounter (INDEPENDENT_AMBULATORY_CARE_PROVIDER_SITE_OTHER): Payer: Self-pay | Admitting: *Deleted

## 2016-06-30 MED ORDER — PEG 3350-KCL-NA BICARB-NACL 420 G PO SOLR: 4000.0000 mL | Freq: Once | ORAL | 0 refills | Status: AC

## 2016-06-30 NOTE — Telephone Encounter (Signed)
Patient needs trilyte 

## 2016-07-12 ENCOUNTER — Telehealth (INDEPENDENT_AMBULATORY_CARE_PROVIDER_SITE_OTHER): Payer: Self-pay | Admitting: *Deleted

## 2016-07-12 NOTE — Telephone Encounter (Signed)
Referring MD/PCP: Ingram   Procedure: tcs  Reason/Indication:  screening  Has patient had this procedure before?  no  If so, when, by whom and where?    Is there a family history of colon cancer?  no  Who?  What age when diagnosed?    Is patient diabetic?   no      Does patient have prosthetic heart valve or mechanical valve?  no  Do you have a pacemaker?  no  Has patient ever had endocarditis? no  Has patient had joint replacement within last 12 months?  no  Does patient tend to be constipated or take laxatives? no  Does patient have a history of alcohol/drug use?  no  Is patient on Coumadin, Plavix and/or Aspirin? no  Medications: hctz 25 mg daily, citalopram 20 mg daily, omeprazole 20 mg daily, tylenol prn, mucinex prn  Allergies: nkda  Medication Adjustment per Dr Laural Golden:   Procedure date & time: 08/11/16 at 1030

## 2016-07-13 NOTE — Telephone Encounter (Signed)
agree

## 2016-08-01 ENCOUNTER — Encounter (INDEPENDENT_AMBULATORY_CARE_PROVIDER_SITE_OTHER): Payer: Self-pay | Admitting: *Deleted

## 2016-08-11 ENCOUNTER — Ambulatory Visit (HOSPITAL_COMMUNITY)
Admission: RE | Admit: 2016-08-11 | Discharge: 2016-08-11 | Disposition: A | Discharge status: Home / Self Care | Payer: Medicare HMO | Source: Ambulatory Visit | Attending: Internal Medicine | Admitting: Internal Medicine

## 2016-08-11 ENCOUNTER — Encounter (HOSPITAL_COMMUNITY)
Admission: RE | Disposition: A | Discharge status: Home / Self Care | Payer: Self-pay | Source: Ambulatory Visit | Attending: Internal Medicine

## 2016-08-11 ENCOUNTER — Encounter (HOSPITAL_COMMUNITY): Payer: Self-pay | Admitting: *Deleted

## 2016-08-11 DIAGNOSIS — G4733 Obstructive sleep apnea (adult) (pediatric): Secondary | ICD-10-CM | POA: Insufficient documentation

## 2016-08-11 DIAGNOSIS — D124 Benign neoplasm of descending colon: Secondary | ICD-10-CM

## 2016-08-11 DIAGNOSIS — G8929 Other chronic pain: Secondary | ICD-10-CM | POA: Insufficient documentation

## 2016-08-11 DIAGNOSIS — Z6841 Body Mass Index (BMI) 40.0 and over, adult: Secondary | ICD-10-CM | POA: Diagnosis not present

## 2016-08-11 DIAGNOSIS — K635 Polyp of colon: Secondary | ICD-10-CM | POA: Diagnosis not present

## 2016-08-11 DIAGNOSIS — K219 Gastro-esophageal reflux disease without esophagitis: Secondary | ICD-10-CM | POA: Insufficient documentation

## 2016-08-11 DIAGNOSIS — F419 Anxiety disorder, unspecified: Secondary | ICD-10-CM | POA: Diagnosis not present

## 2016-08-11 DIAGNOSIS — Z9889 Other specified postprocedural states: Secondary | ICD-10-CM | POA: Diagnosis not present

## 2016-08-11 DIAGNOSIS — Z885 Allergy status to narcotic agent status: Secondary | ICD-10-CM | POA: Diagnosis not present

## 2016-08-11 DIAGNOSIS — Z1211 Encounter for screening for malignant neoplasm of colon: Secondary | ICD-10-CM | POA: Insufficient documentation

## 2016-08-11 DIAGNOSIS — M25561 Pain in right knee: Secondary | ICD-10-CM | POA: Insufficient documentation

## 2016-08-11 DIAGNOSIS — Z794 Long term (current) use of insulin: Secondary | ICD-10-CM | POA: Insufficient documentation

## 2016-08-11 DIAGNOSIS — D123 Benign neoplasm of transverse colon: Secondary | ICD-10-CM | POA: Insufficient documentation

## 2016-08-11 DIAGNOSIS — Z79899 Other long term (current) drug therapy: Secondary | ICD-10-CM | POA: Diagnosis not present

## 2016-08-11 DIAGNOSIS — F329 Major depressive disorder, single episode, unspecified: Secondary | ICD-10-CM | POA: Insufficient documentation

## 2016-08-11 DIAGNOSIS — K648 Other hemorrhoids: Secondary | ICD-10-CM | POA: Diagnosis not present

## 2016-08-11 DIAGNOSIS — D122 Benign neoplasm of ascending colon: Secondary | ICD-10-CM | POA: Diagnosis not present

## 2016-08-11 DIAGNOSIS — F17210 Nicotine dependence, cigarettes, uncomplicated: Secondary | ICD-10-CM | POA: Insufficient documentation

## 2016-08-11 DIAGNOSIS — D127 Benign neoplasm of rectosigmoid junction: Secondary | ICD-10-CM | POA: Diagnosis not present

## 2016-08-11 DIAGNOSIS — Z8262 Family history of osteoporosis: Secondary | ICD-10-CM | POA: Diagnosis not present

## 2016-08-11 HISTORY — DX: Dyspnea, unspecified: R06.00

## 2016-08-11 HISTORY — DX: Gastro-esophageal reflux disease without esophagitis: K21.9

## 2016-08-11 HISTORY — PX: POLYPECTOMY: SHX5525

## 2016-08-11 HISTORY — PX: COLONOSCOPY: SHX5424

## 2016-08-11 HISTORY — DX: Calculus of kidney: N20.0

## 2016-08-11 SURGERY — COLONOSCOPY
Anesthesia: Moderate Sedation

## 2016-08-11 MED ORDER — MEPERIDINE HCL 50 MG/ML IJ SOLN
INTRAMUSCULAR | Status: DC | PRN
  Administered 2016-08-11 (×2): 25 mg

## 2016-08-11 MED ORDER — SODIUM CHLORIDE 0.9 % IV SOLN
INTRAVENOUS | Status: DC
  Administered 2016-08-11: 10:00:00 via INTRAVENOUS

## 2016-08-11 MED ORDER — MIDAZOLAM HCL 5 MG/5ML IJ SOLN
INTRAMUSCULAR | Status: DC | PRN
  Administered 2016-08-11 (×2): 2 mg via INTRAVENOUS

## 2016-08-11 MED ORDER — MIDAZOLAM HCL 5 MG/5ML IJ SOLN
INTRAMUSCULAR | Status: AC
  Filled 2016-08-11: qty 10

## 2016-08-11 MED ORDER — MEPERIDINE HCL 50 MG/ML IJ SOLN
INTRAMUSCULAR | Status: AC
  Filled 2016-08-11: qty 1

## 2016-08-11 NOTE — Op Note (Signed)
[facility] Patient Name: Michael Tate Procedure Date: 08/11/2016 11:04 AM MRN: [id number] Date of Birth: 11/25/1965 Attending MD: Hildred Laser , MD CSN: [id number] Age: 51 Admit Type: Outpatient Procedure:                Colonoscopy Indications:              Screening for colorectal malignant neoplasm Providers:                Hildred Laser, MD, Lurline Del, RN, Rosina Lowenstein, RN Referring MD:             Modena Nunnery. [place], MD Medicines:                Meperidine 50 mg IV, Midazolam 4 mg IV Complications:            No immediate complications. Estimated Blood Loss:     Estimated blood loss was minimal. Procedure:                Pre-Anesthesia Assessment:                           - Prior to the procedure, a History and Physical                            was performed, and patient medications and                            allergies were reviewed. The patient's tolerance of                            previous anesthesia was also reviewed. The risks                            and benefits of the procedure and the sedation                            options and risks were discussed with the patient.                            All questions were answered, and informed consent                            was obtained. Prior Anticoagulants: The patient has                            taken no previous anticoagulant or antiplatelet                            agents. ASA Grade Assessment: III - A patient with                            severe systemic disease. After reviewing the risks                            and benefits, the patient was deemed in  satisfactory condition to undergo the procedure.                           After obtaining informed consent, the colonoscope                            was passed under direct vision. Throughout the                            procedure, the patient's blood pressure, pulse, and                            oxygen  saturations were monitored continuously. The                            [id number] ([id number]) was introduced through the                            anus and advanced to the the cecum, identified by                            appendiceal orifice and ileocecal valve. The                            colonoscopy was performed without difficulty. The                            patient tolerated the procedure well. The quality                            of the bowel preparation was adequate. The                            ileocecal valve, appendiceal orifice, and rectum                            were photographed. Scope In: 11:31:47 AM Scope Out: 11:56:30 AM Scope Withdrawal Time: 0 hours 22 minutes 5 seconds  Total Procedure Duration: 0 hours 24 minutes 43 seconds  Findings:      The perianal and digital rectal examinations were normal.      Two sessile polyps were found in the hepatic flexure and ascending       colon. The polyps were small in size. These were biopsied with a cold       forceps for histology. The pathology specimen was placed into Bottle       Number 1.      Two sessile polyps were found in the descending colon. The polyps were 5       to 6 mm in size. These polyps were removed with a cold snare. Resection       and retrieval were complete. The pathology specimen was placed into       Bottle Number 1.      A 8 mm polyp was found in the recto-sigmoid colon. The polyp was       semi-pedunculated. The polyp was removed with a  hot snare. Resection and       retrieval were complete. The pathology specimen was placed into Bottle       Number 2.      Internal hemorrhoids were found during retroflexion. The hemorrhoids       were small. Impression:               - Two small polyps at the hepatic flexure and in                            the ascending colon. Biopsied.                           - Two 5 to 6 mm polyps in the descending colon,                            removed with a  cold snare. Resected and retrieved.                           - One 8 mm polyp at the recto-sigmoid colon,                            removed with a hot snare. Resected and retrieved.                           - Internal hemorrhoids. Moderate Sedation:      Moderate (conscious) sedation was administered by the endoscopy nurse       and supervised by the endoscopist. The following parameters were       monitored: oxygen saturation, heart rate, blood pressure, CO2       capnography and response to care. Total physician intraservice time was       29 minutes. Recommendation:           - Patient has a contact number available for                            emergencies. The signs and symptoms of potential                            delayed complications were discussed with the                            patient. Return to normal activities tomorrow.                            Written discharge instructions were provided to the                            patient.                           - Resume previous diet today.                           - Continue present medications.                           -  No aspirin, ibuprofen, naproxen, or other                            non-steroidal anti-inflammatory drugs for 7 days                            after polyp removal.                           - Await pathology results.                           - Repeat colonoscopy for surveillance based on                            pathology results. Procedure Code(s):        --- Professional ---                           [phone], Colonoscopy, flexible; with removal of                            tumor(s), polyp(s), or other lesion(s) by snare                            technique                           45380, 59, Colonoscopy, flexible; with biopsy,                            single or multiple                           99152, Moderate sedation services provided by the                            same  physician or other qualified health care                            professional performing the diagnostic or                            therapeutic service that the sedation supports,                            requiring the presence of an independent trained                            observer to assist in the monitoring of the                            patient's level of consciousness and physiological                            status; initial 15 minutes of intraservice time,  patient age 47 years or older                           [phone], Moderate sedation services; each additional                            15 minutes intraservice time Diagnosis Code(s):        --- Professional ---                           Z12.11, Encounter for screening for malignant                            neoplasm of colon                           D12.3, Benign neoplasm of transverse colon (hepatic                            flexure or splenic flexure)                           D12.2, Benign neoplasm of ascending colon                           D12.4, Benign neoplasm of descending colon                           D12.7, Benign neoplasm of rectosigmoid junction                           K64.8, Other hemorrhoids CPT copyright 2016 American Medical Association. All rights reserved. The codes documented in this report are preliminary and upon coder review may  be revised to meet current compliance requirements. Hildred Laser, MD Hildred Laser, MD 08/11/2016 12:05:17 PM This report has been signed electronically. Number of Addenda: 0

## 2016-08-11 NOTE — Discharge Instructions (Signed)
No Aspirin and NSAIDs for one week. Resume usual medications and diet. No driving for 24 hours. Physician will call with biopsy results.       Colonoscopy, Adult, Care After This sheet gives you information about how to care for yourself after your procedure. Your doctor may also give you more specific instructions. If you have problems or questions, call your doctor. Follow these instructions at home: General instructions   For the first 24 hours after the procedure: ? Do not drive or use machinery. ? Do not sign important documents. ? Do not drink alcohol. ? Do your daily activities more slowly than normal. ? Eat foods that are soft and easy to digest. ? Rest often.  Take over-the-counter or prescription medicines only as told by your doctor.  It is up to you to get the results of your procedure. Ask your doctor, or the department performing the procedure, when your results will be ready. To help cramping and bloating:  Try walking around.  Put heat on your belly (abdomen) as told by your doctor. Use a heat source that your doctor recommends, such as a moist heat pack or a heating pad. ? Put a towel between your skin and the heat source. ? Leave the heat on for 20-30 minutes. ? Remove the heat if your skin turns bright red. This is especially important if you cannot feel pain, heat, or cold. You can get burned. Eating and drinking  Drink enough fluid to keep your pee (urine) clear or pale yellow.  Return to your normal diet as told by your doctor. Avoid heavy or fried foods that are hard to digest.  Avoid drinking alcohol for as long as told by your doctor. Contact a doctor if:  You have blood in your poop (stool) 2-3 days after the procedure. Get help right away if:  You have more than a small amount of blood in your poop.  You see large clumps of tissue (blood clots) in your poop.  Your belly is swollen.  You feel sick to your stomach (nauseous).  You throw  up (vomit).  You have a fever.  You have belly pain that gets worse, and medicine does not help your pain. This information is not intended to replace advice given to you by your health care provider. Make sure you discuss any questions you have with your health care provider. Document Released: 03/19/2010 Document Revised: 11/09/2015 Document Reviewed: 11/09/2015 Elsevier Interactive Patient Education  2017 [employer].    Colon Polyps Polyps are tissue growths inside the body. Polyps can grow in many places, including the large intestine (colon). A polyp may be a round bump or a mushroom-shaped growth. You could have one polyp or several. Most colon polyps are noncancerous (benign). However, some colon polyps can become cancerous over time. What are the causes? The exact cause of colon polyps is not known. What increases the risk? This condition is more likely to develop in people who:  Have a family history of colon cancer or colon polyps.  Are older than 81 or older than 45 if they are African American.  Have inflammatory bowel disease, such as ulcerative colitis or Crohn disease.  Are overweight.  Smoke cigarettes.  Do not get enough exercise.  Drink too much alcohol.  Eat a diet that is: ? High in fat and red meat. ? Low in fiber.  Had childhood cancer that was treated with abdominal radiation.  What are the signs or symptoms? Most polyps  do not cause symptoms. If you have symptoms, they may include:  Blood coming from your rectum when having a bowel movement.  Blood in your stool.The stool may look dark red or black.  A change in bowel habits, such as constipation or diarrhea.  How is this diagnosed? This condition is diagnosed with a colonoscopy. This is a procedure that uses a lighted, flexible scope to look at the inside of your colon. How is this treated? Treatment for this condition involves removing any polyps that are found. Those polyps will then  be tested for cancer. If cancer is found, your health care provider will talk to you about options for colon cancer treatment. Follow these instructions at home: Diet  Eat plenty of fiber, such as fruits, vegetables, and whole grains.  Eat foods that are high in calcium and vitamin D, such as milk, cheese, yogurt, eggs, liver, fish, and broccoli.  Limit foods high in fat, red meats, and processed meats, such as hot dogs, sausage, bacon, and lunch meats.  Maintain a healthy weight, or lose weight if recommended by your health care provider. General instructions  Do not smoke cigarettes.  Do not drink alcohol excessively.  Keep all follow-up visits as told by your health care provider. This is important. This includes keeping regularly scheduled colonoscopies. Talk to your health care provider about when you need a colonoscopy.  Exercise every day or as told by your health care provider. Contact a health care provider if:  You have new or worsening bleeding during a bowel movement.  You have new or increased blood in your stool.  You have a change in bowel habits.  You unexpectedly lose weight. This information is not intended to replace advice given to you by your health care provider. Make sure you discuss any questions you have with your health care provider. Document Released: 11/11/2003 Document Revised: 07/23/2015 Document Reviewed: 01/05/2015 Elsevier Interactive Patient Education  2018 [employer].     Hemorrhoids Hemorrhoids are swollen veins in and around the rectum or anus. There are two types of hemorrhoids:  Internal hemorrhoids. These occur in the veins that are just inside the rectum. They may poke through to the outside and become irritated and painful.  External hemorrhoids. These occur in the veins that are outside of the anus and can be felt as a painful swelling or hard lump near the anus.  Most hemorrhoids do not cause serious problems, and they can be  managed with home treatments such as diet and lifestyle changes. If home treatments do not help your symptoms, procedures can be done to shrink or remove the hemorrhoids. What are the causes? This condition is caused by increased pressure in the anal area. This pressure may result from various things, including:  Constipation.  Straining to have a bowel movement.  Diarrhea.  Pregnancy.  Obesity.  Sitting for long periods of time.  Heavy lifting or other activity that causes you to strain.  Anal sex.  What are the signs or symptoms? Symptoms of this condition include:  Pain.  Anal itching or irritation.  Rectal bleeding.  Leakage of stool (feces).  Anal swelling.  One or more lumps around the anus.  How is this diagnosed? This condition can often be diagnosed through a visual exam. Other exams or tests may also be done, such as:  Examination of the rectal area with a gloved hand (digital rectal exam).  Examination of the anal canal using a small tube (anoscope).  A  blood test, if you have lost a significant amount of blood.  A test to look inside the colon (sigmoidoscopy or colonoscopy).  How is this treated? This condition can usually be treated at home. However, various procedures may be done if dietary changes, lifestyle changes, and other home treatments do not help your symptoms. These procedures can help make the hemorrhoids smaller or remove them completely. Some of these procedures involve surgery, and others do not. Common procedures include:  Rubber band ligation. Rubber bands are placed at the base of the hemorrhoids to cut off the blood supply to them.  Sclerotherapy. Medicine is injected into the hemorrhoids to shrink them.  Infrared coagulation. A type of light energy is used to get rid of the hemorrhoids.  Hemorrhoidectomy surgery. The hemorrhoids are surgically removed, and the veins that supply them are tied off.  Stapled hemorrhoidopexy  surgery. A circular stapling device is used to remove the hemorrhoids and use staples to cut off the blood supply to them.  Follow these instructions at home: Eating and drinking  Eat foods that have a lot of fiber in them, such as whole grains, beans, nuts, fruits, and vegetables. Ask your health care provider about taking products that have added fiber (fiber supplements).  Drink enough fluid to keep your urine clear or pale yellow. Managing pain and swelling  Take warm sitz baths for 20 minutes, 3-4 times a day to ease pain and discomfort.  If directed, apply ice to the affected area. Using ice packs between sitz baths may be helpful. ? Put ice in a plastic bag. ? Place a towel between your skin and the bag. ? Leave the ice on for 20 minutes, 2-3 times a day. General instructions  Take over-the-counter and prescription medicines only as told by your health care provider.  Use medicated creams or suppositories as told.  Exercise regularly.  Go to the bathroom when you have the urge to have a bowel movement. Do not wait.  Avoid straining to have bowel movements.  Keep the anal area dry and clean. Use wet toilet paper or moist towelettes after a bowel movement.  Do not sit on the toilet for long periods of time. This increases blood pooling and pain. Contact a health care provider if:  You have increasing pain and swelling that are not controlled by treatment or medicine.  You have uncontrolled bleeding.  You have difficulty having a bowel movement, or you are unable to have a bowel movement.  You have pain or inflammation outside the area of the hemorrhoids. This information is not intended to replace advice given to you by your health care provider. Make sure you discuss any questions you have with your health care provider. Document Released: 02/12/2000 Document Revised: 07/15/2015 Document Reviewed: 10/29/2014 Elsevier Interactive Patient Education  2017 [employer].
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
## 2016-08-16 ENCOUNTER — Encounter (HOSPITAL_COMMUNITY): Payer: Self-pay | Admitting: Internal Medicine

## 2016-09-27 ENCOUNTER — Ambulatory Visit: Payer: Medicare HMO | Admitting: Family Medicine

## 2016-09-28 ENCOUNTER — Encounter: Payer: Self-pay | Admitting: Family Medicine

## 2016-09-28 ENCOUNTER — Ambulatory Visit (INDEPENDENT_AMBULATORY_CARE_PROVIDER_SITE_OTHER): Payer: Medicare HMO | Admitting: Family Medicine

## 2016-09-28 VITALS — BP 136/84 | HR 68 | Temp 98.5°F | Resp 20 | Wt 363.0 lb

## 2016-09-28 DIAGNOSIS — H5789 Other specified disorders of eye and adnexa: Secondary | ICD-10-CM

## 2016-09-28 DIAGNOSIS — H578 Other specified disorders of eye and adnexa: Secondary | ICD-10-CM | POA: Diagnosis not present

## 2016-09-28 DIAGNOSIS — H10403 Unspecified chronic conjunctivitis, bilateral: Secondary | ICD-10-CM

## 2016-09-28 MED ORDER — POLYMYXIN B-TRIMETHOPRIM 10000-0.1 UNIT/ML-% OP SOLN: 2.0000 [drp] | Freq: Four times a day (QID) | OPHTHALMIC | 0 refills | Status: DC

## 2016-09-28 NOTE — Patient Instructions (Signed)
Start eye drops Referral to eye doctor

## 2016-09-28 NOTE — Progress Notes (Signed)
   Subjective:    Patient ID: Michael Tate, male    DOB: 14-Mar-1965, 51 y.o.   MRN: [id number]  Patient presents for Acute Visit (Yellow discharge from eyes x months)  Patient here with bilateral eye redness and drainage and discomfort for the past few months. States that he saw optometrist at [facility] in  [place] about 5 or 6 months ago they told him he had dry eye but since then he has had red eyes now having yellow discharge from both eyes irritation. He has been using Systane drops which were prescribed but he is not improving. He does have some mild blurriness to hips vision when he gets a film over his eyes. Denies any sinusitis or drainage noted. A no URI symptoms no fever no headache he does not wear contacts.   Review Of Systems:  GEN- denies fatigue, fever, weight loss,weakness, recent illness HEENT- + eye drainage, +change in vision, nasal discharge, CVS- denies chest pain, palpitations RESP- denies SOB, cough, wheeze ABD- denies N/V, change in stools, abd pain GU- denies dysuria, hematuria, dribbling, incontinence MSK- denies joint pain, muscle aches, injury Neuro- denies headache, dizziness, syncope, seizure activity       Objective:    BP 136/84   Pulse 68   Temp 98.5 F (36.9 C)   Resp 20   Wt (!) 363 lb (164.7 kg)   SpO2 93%   BMI 55.19 kg/m  GEN- NAD, alert and oriented x3 HEENT- PERRL, EOMI, left  injected sclera, injected conjunctiva, mild yellow drainage bilat eyes , no swelling of lids, vision grossly in tact  MMM, oropharynx clear Neck- Supple, no LAD          Assessment & Plan:      Problem List Items Addressed This Visit    None    Visit Diagnoses    Chronic conjunctivitis of both eyes, unspecified chronic conjunctivitis type    -  Primary   chronic conjunctivitis appearance, set up with opthalmology, for now will give antibiotic drops. Vision grossly in tact   Relevant Orders   Ambulatory referral to Ophthalmology   Eye drainage        Relevant Orders   Ambulatory referral to Ophthalmology      Note: This dictation was prepared with Dragon dictation along with smaller phrase technology. Any transcriptional errors that result from this process are unintentional.

## 2017-03-09 ENCOUNTER — Other Ambulatory Visit: Payer: Self-pay | Admitting: Family Medicine

## 2017-04-19 ENCOUNTER — Other Ambulatory Visit: Payer: Self-pay | Admitting: Family Medicine

## 2017-04-25 ENCOUNTER — Ambulatory Visit (INDEPENDENT_AMBULATORY_CARE_PROVIDER_SITE_OTHER): Payer: Medicare HMO | Admitting: Family Medicine

## 2017-04-25 ENCOUNTER — Encounter: Payer: Self-pay | Admitting: Family Medicine

## 2017-04-25 ENCOUNTER — Other Ambulatory Visit: Payer: Self-pay

## 2017-04-25 VITALS — BP 138/82 | HR 72 | Temp 97.8°F | Resp 22 | Ht 67.0 in | Wt 363.0 lb

## 2017-04-25 DIAGNOSIS — E782 Mixed hyperlipidemia: Secondary | ICD-10-CM | POA: Diagnosis not present

## 2017-04-25 DIAGNOSIS — F331 Major depressive disorder, recurrent, moderate: Secondary | ICD-10-CM | POA: Diagnosis not present

## 2017-04-25 DIAGNOSIS — R609 Edema, unspecified: Secondary | ICD-10-CM

## 2017-04-25 DIAGNOSIS — M25562 Pain in left knee: Secondary | ICD-10-CM

## 2017-04-25 DIAGNOSIS — R7302 Impaired glucose tolerance (oral): Secondary | ICD-10-CM | POA: Diagnosis not present

## 2017-04-25 DIAGNOSIS — G4733 Obstructive sleep apnea (adult) (pediatric): Secondary | ICD-10-CM | POA: Diagnosis not present

## 2017-04-25 DIAGNOSIS — F329 Major depressive disorder, single episode, unspecified: Secondary | ICD-10-CM | POA: Insufficient documentation

## 2017-04-25 DIAGNOSIS — E785 Hyperlipidemia, unspecified: Secondary | ICD-10-CM | POA: Insufficient documentation

## 2017-04-25 DIAGNOSIS — M1712 Unilateral primary osteoarthritis, left knee: Secondary | ICD-10-CM

## 2017-04-25 MED ORDER — NEOMYCIN-POLYMYXIN-DEXAMETH 0.1 % OP OINT: 1.0000 "application " | TOPICAL_OINTMENT | Freq: Every day | OPHTHALMIC | 3 refills | Status: AC

## 2017-04-25 MED ORDER — MELOXICAM 15 MG PO TABS: 15.0000 mg | ORAL_TABLET | Freq: Every day | ORAL | 1 refills | Status: AC

## 2017-04-25 MED ORDER — FUROSEMIDE 20 MG PO TABS: 20.0000 mg | ORAL_TABLET | Freq: Every day | ORAL | 3 refills | Status: AC

## 2017-04-25 NOTE — Assessment & Plan Note (Signed)
He does get benefit from use of his CPAP as he does have obstructive sleep apnea and is morbidly obese.  We will send a new mask.

## 2017-04-25 NOTE — Patient Instructions (Addendum)
Take the lasix 20mg  once a day  Use eye drop as needed  Referral to orthopedics  We will call with lab results  F/U 1 month for swelling

## 2017-04-25 NOTE — Assessment & Plan Note (Signed)
Increase in edema on exam.  He has a very poor diet minimal physical activity.  We will give him Lasix 20 mg to start.  Attempted to draw his labs however he became upset and was hostile towards the lab technician stating that I would not give him what he wanted pain was so he was not can have his blood drawn.

## 2017-04-25 NOTE — Progress Notes (Signed)
Subjective:    Patient ID: Michael Tate, male    DOB: 04-23-1965, 52 y.o.   MRN: [id number]  Patient presents for L Knee/ Back Pain and Depression   Pt here with left knee pain, twisted his left knee a month or so ago going down his back steps and step cracked.Has had swelling on and off and continued daily pain, feels like it gives out on him.   Following with his psychiatrist at [facility], has appointment tomorrow.  States that he is depressed none his medications help.  He stopped taking all the once a psychiatrist gave him.  He was asking for nasal ketamine that he saw on TV.  Continues to have chronic back pain which he is failed multiple narcotic contract for and I told him throughout the years I would not prescribe any narcotics for him.  Request a refill on his eye appointment states he cannot afford to go back to his ophthalmologist.  States that he needs a new CPAP mask seems like it is leaking air      Review Of Systems:  GEN- denies fatigue, fever, weight loss,weakness, recent illness HEENT- denies eye drainage, change in vision, nasal discharge, CVS- denies chest pain, palpitations RESP- denies SOB, cough, wheeze ABD- denies N/V, change in stools, abd pain GU- denies dysuria, hematuria, dribbling, incontinence MSK- +joint pain, muscle aches, injury Neuro- denies headache, dizziness, syncope, seizure activity       Objective:    BP 138/82   Pulse 72   Temp 97.8 F (36.6 C) (Oral)   Resp (!) 22   Ht 5\' 7"  (1.702 m)   Wt (!) 363 lb (164.7 kg)   BMI 56.85 kg/m  GEN- NAD, alert and oriented x3 HEENT- PERRL, EOMI, non injected sclera, pink conjunctiva, MMM, oropharynx clear CVS- RRR, no murmur RESP-CTAB Psych- depressed affect, no Active SI, not anxious appearing  MSK- Decreased ROM bilat hips/knee, no knee effusion, TTP popliteal region left knee, no erythema  EXT- 1+ pitting  Edema,venous stasis changes Pulses- Radial  2+        Assessment & Plan:      Major Depressive disorder.  He does not want to take any other medications prescribed by his psychiatrist.  Nothing seems to work at all.  Advised him that I would not give him a nasal ketamine.  He talked about not wanting to be here but has no active plan to kill himself states that he would never do that.  Entire visit he was moving stating how life was not fair and how he was a failure. Problem List Items Addressed This Visit      Unprioritized   Glucose intolerance (impaired glucose tolerance)   Relevant Orders   CBC with Differential/Platelet   Comprehensive metabolic panel   Hemoglobin A1c   Morbid obesity (HCC)   Peripheral edema    Increase in edema on exam.  He has a very poor diet minimal physical activity.  We will give him Lasix 20 mg to start.  Attempted to draw his labs however he became upset and was hostile towards the lab technician stating that I would not give him what he wanted pain was so he was not can have his blood drawn.  I have multiple encounters with him regarding his attitude, since I wont prescribe narcotics he refuses to follow through with other treatments. This physician patient relationship is compromised  I will send him letter indicating dismissal from practice.  I will proceed  with the things I attended to treat today.       Relevant Orders   CBC with Differential/Platelet   Comprehensive metabolic panel   OSA (obstructive sleep apnea) - Primary    He does get benefit from use of his CPAP as he does have obstructive sleep apnea and is morbidly obese.  We will send a new mask.      Hyperlipidemia   Relevant Medications   furosemide (LASIX) 20 MG tablet   Other Relevant Orders   Lipid panel    Other Visit Diagnoses    Acute pain of left knee       Injury however has known osteoarthritis and morbid obesity contributing to his chronic joint pains.  I did prescribe meloxicam.  Seems after the visit he was not happy I had already told him that I  would not prescribe him narcotics He has a meniscal tear or ligamental strain based on his mechanism of fall   Primary osteoarthritis of left knee       Relevant Medications   meloxicam (MOBIC) 15 MG tablet      Note: This dictation was prepared with Dragon dictation along with smaller phrase technology. Any transcriptional errors that result from this process are unintentional.

## 2017-04-29 IMAGING — DX DG CHEST 2V
2 series · 2 of 2 positions shown · non-contrast
Comparison: 11/08/2014.

CLINICAL DATA: Palpitations .

EXAM:
CHEST  2 VIEW

[chest pa]
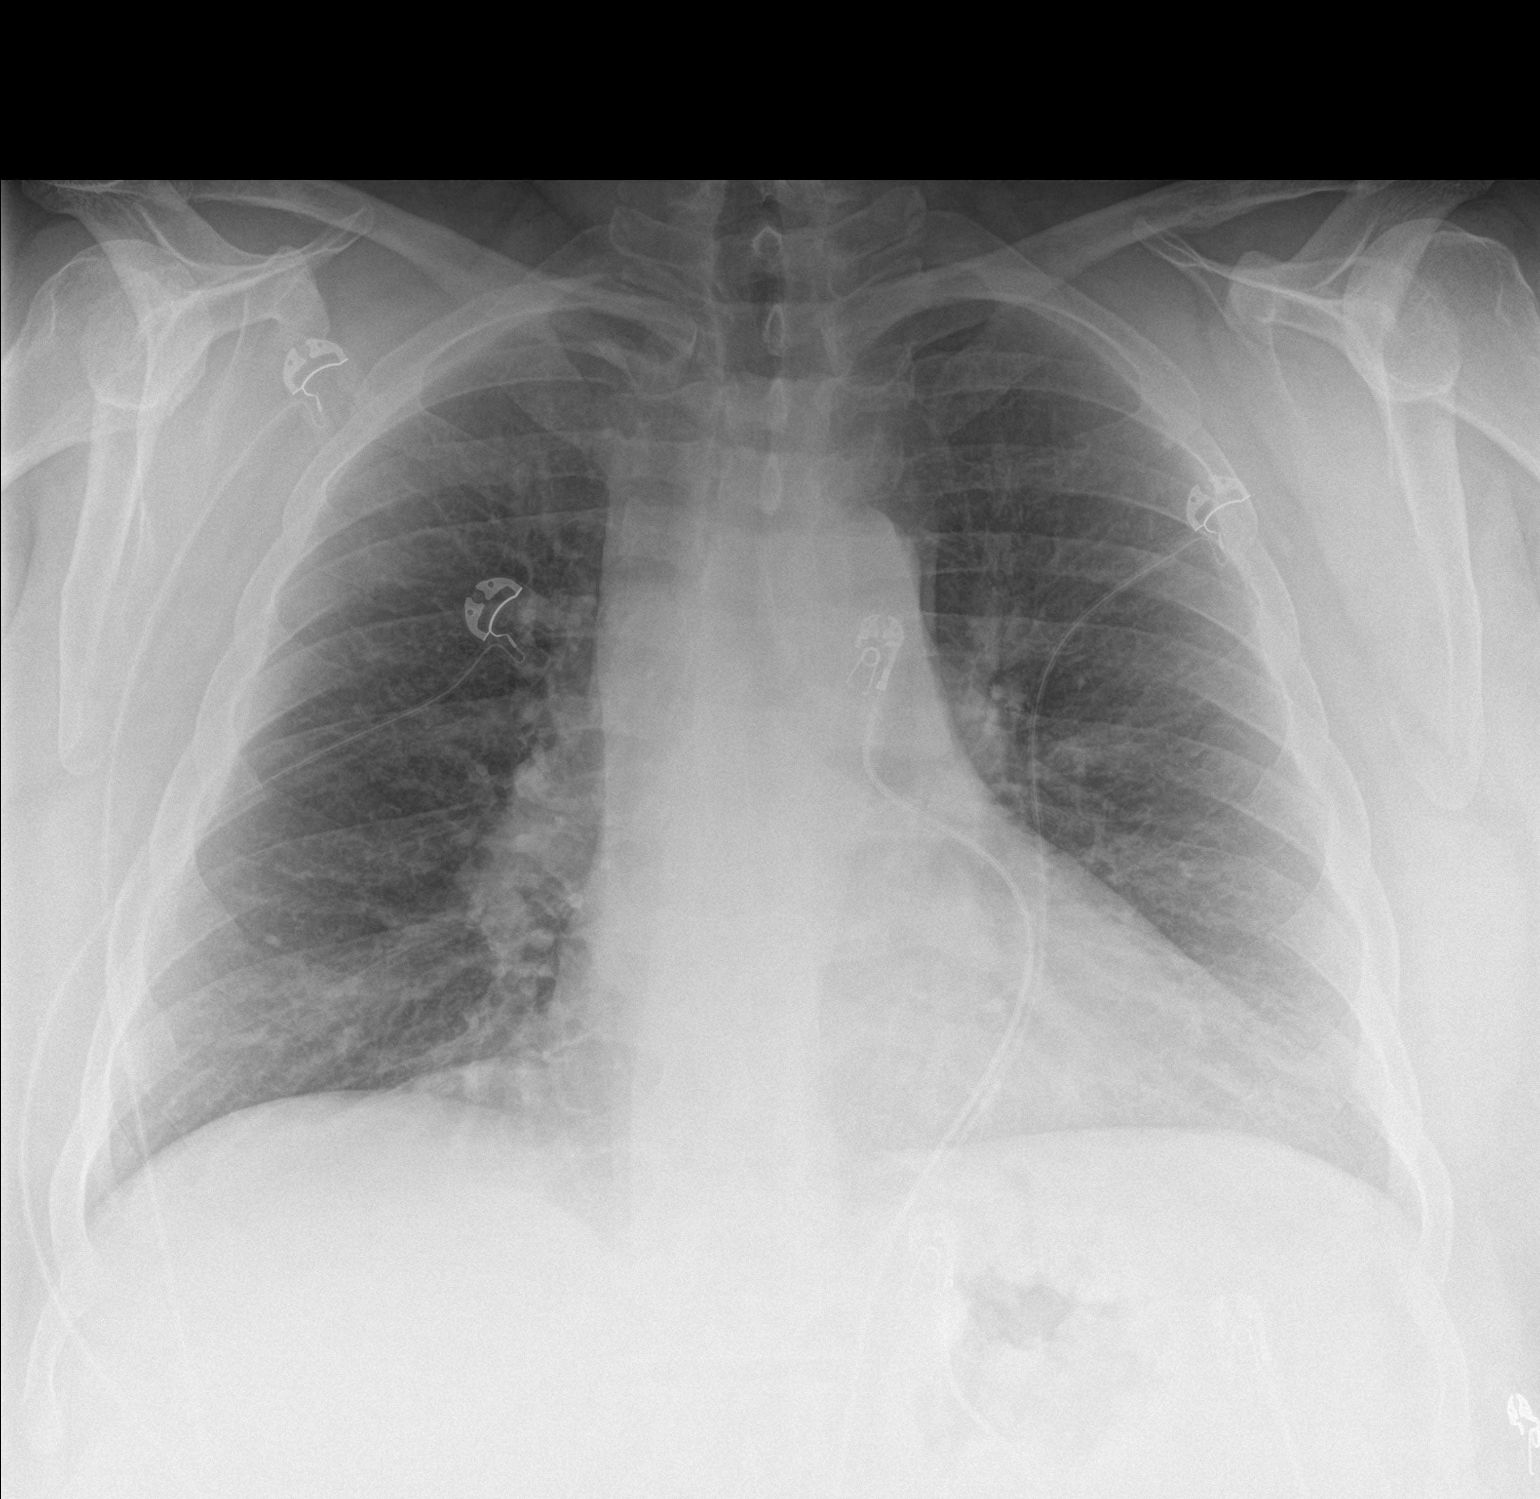

[chest lat]
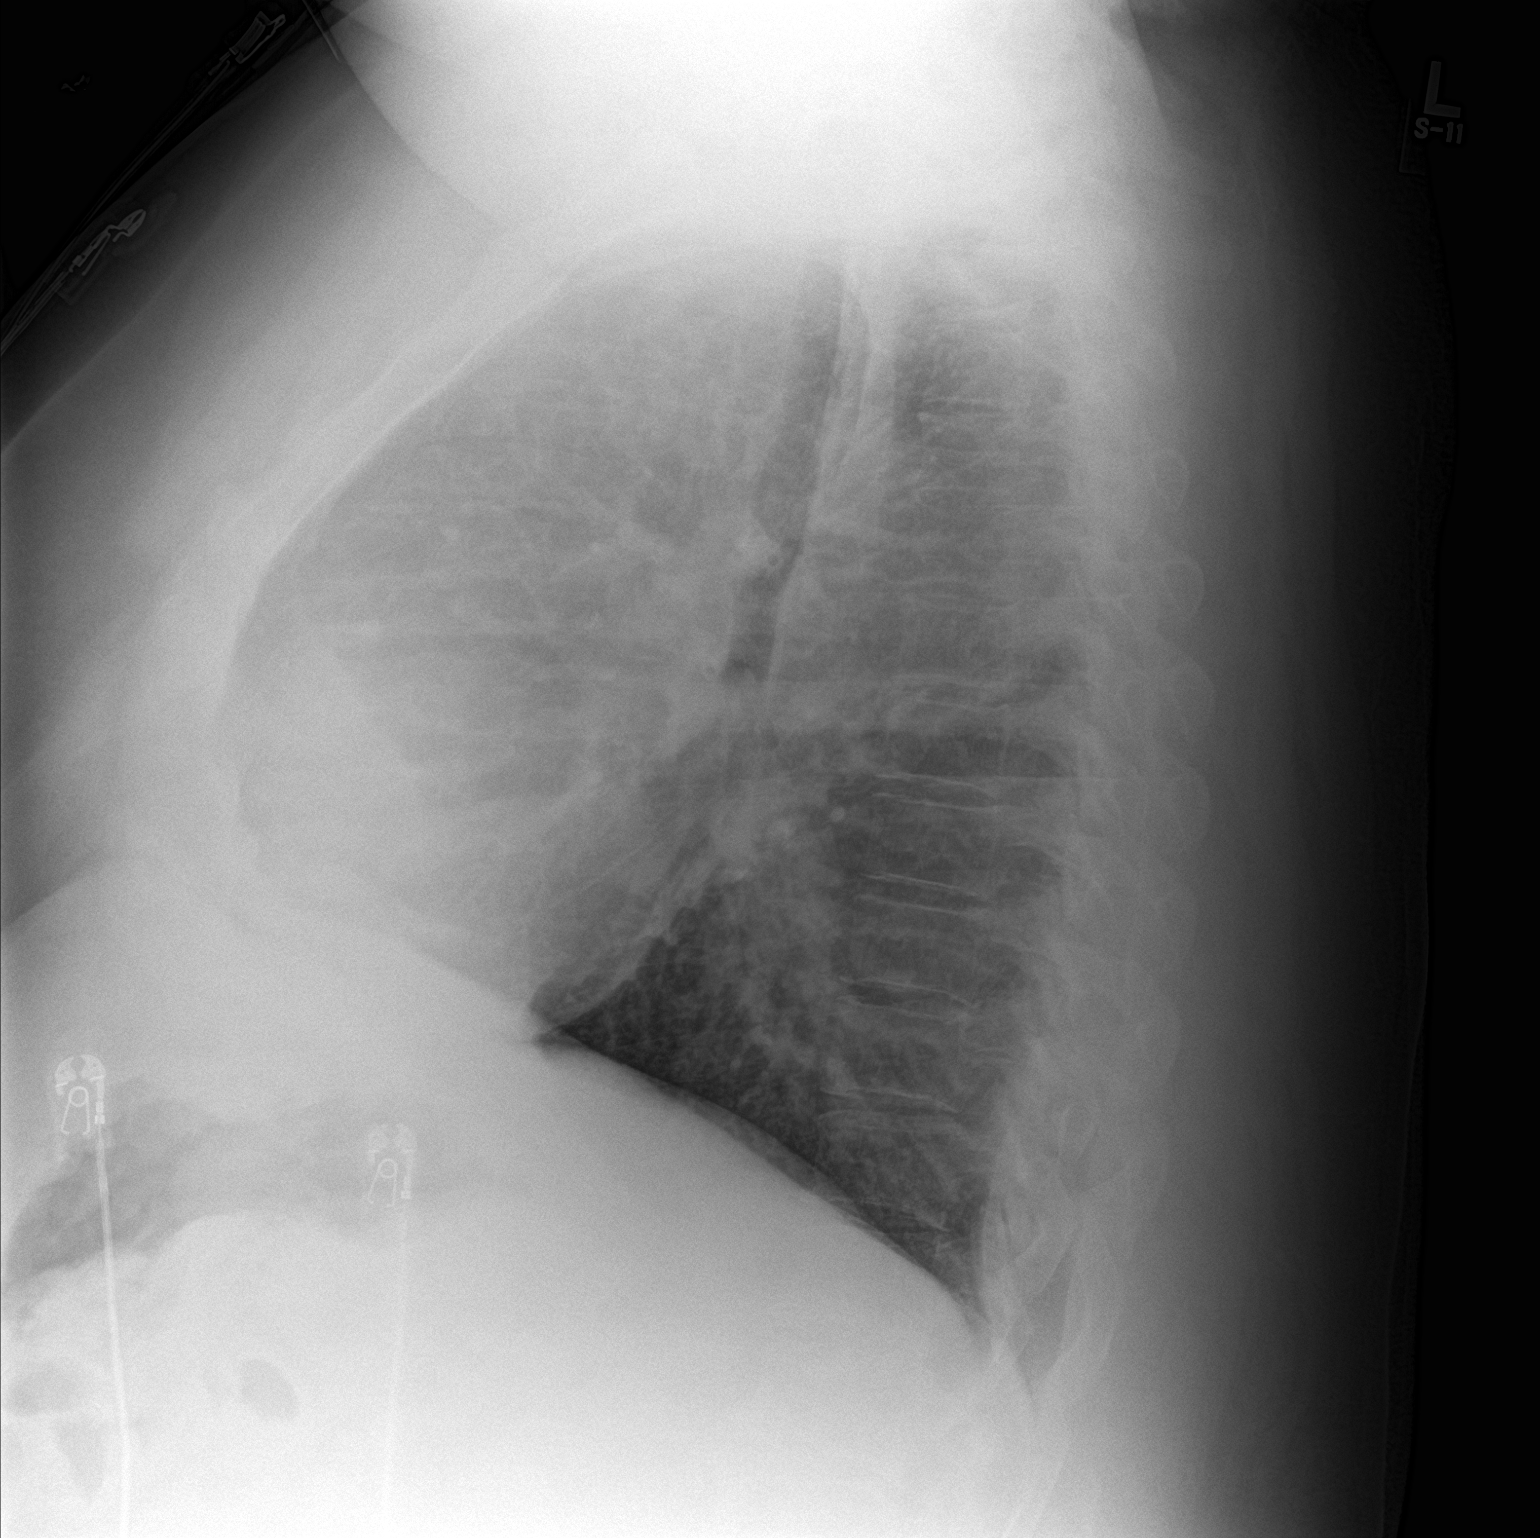

[2 of 2 positions shown; findings below may reference images not displayed]

FINDINGS: Cardiomegaly with normal pulmonary vascularity. Mild right base
subsegmental atelectasis. No pleural effusion or pneumothorax. No
acute bony abnormality.
IMPRESSION: 1. Stable cardiomegaly.

2.  Mild right base subsegmental atelectasis.

## 2018-01-28 DEATH — deceased
